# Patient Record
Sex: Female | Born: 1970 | Race: White | Hispanic: No | Marital: Married | Smoking: Former smoker
Health system: Southern US, Community
[De-identification: ages and names within clinical notes are randomized; demographics above are authoritative.]

## PROBLEM LIST (undated history)

## (undated) DIAGNOSIS — Z8619 Personal history of other infectious and parasitic diseases: Secondary | ICD-10-CM

## (undated) DIAGNOSIS — K219 Gastro-esophageal reflux disease without esophagitis: Secondary | ICD-10-CM

## (undated) DIAGNOSIS — I10 Essential (primary) hypertension: Secondary | ICD-10-CM

## (undated) DIAGNOSIS — T7840XA Allergy, unspecified, initial encounter: Secondary | ICD-10-CM

## (undated) HISTORY — DX: Personal history of other infectious and parasitic diseases: Z86.19

## (undated) HISTORY — DX: Essential (primary) hypertension: I10

## (undated) HISTORY — DX: Allergy, unspecified, initial encounter: T78.40XA

## (undated) HISTORY — DX: Gastro-esophageal reflux disease without esophagitis: K21.9

---

## 2000-03-07 ENCOUNTER — Other Ambulatory Visit: Admission: RE | Admit: 2000-03-07 | Discharge: 2000-03-07 | Payer: Self-pay | Admitting: *Deleted

## 2004-01-11 ENCOUNTER — Emergency Department (HOSPITAL_COMMUNITY): Admission: EM | Admit: 2004-01-11 | Discharge: 2004-01-11 | Payer: Self-pay | Admitting: Emergency Medicine

## 2004-03-15 ENCOUNTER — Emergency Department (HOSPITAL_COMMUNITY): Admission: EM | Admit: 2004-03-15 | Discharge: 2004-03-15 | Payer: Self-pay | Admitting: Emergency Medicine

## 2007-04-10 ENCOUNTER — Ambulatory Visit (HOSPITAL_COMMUNITY): Admission: RE | Admit: 2007-04-10 | Discharge: 2007-04-10 | Payer: Self-pay | Admitting: Obstetrics and Gynecology

## 2007-07-14 ENCOUNTER — Inpatient Hospital Stay (HOSPITAL_COMMUNITY): Admission: AD | Admit: 2007-07-14 | Discharge: 2007-07-23 | Payer: Self-pay | Admitting: Obstetrics & Gynecology

## 2010-11-22 NOTE — Op Note (Signed)
Brenda Lopez, Brenda Lopez                  ACCOUNT NO.:  [id number]   MEDICAL RECORD NO.:  [id number]          PATIENT TYPE:  INP   LOCATION:  9128                          FACILITY:  [facility]   PHYSICIAN:  Kendra H. Tenny Craw, MD     DATE OF BIRTH:  [birth year]   DATE OF PROCEDURE:  07/15/2007  DATE OF DISCHARGE:                               OPERATIVE REPORT   PREOPERATIVE DIAGNOSES:  1. 41-week intrauterine pregnancy.  2. Maternal exhaustion and severe neck pain.  3. Nonreassuring fetal well being.  4. Failure to progress.   POSTOPERATIVE DIAGNOSES:  1. 41-week intrauterine pregnancy.  2. Maternal exhaustion and severe neck pain.  3. Nonreassuring fetal well being.  4. Failure to progress.   PROCEDURE:  Primary low transverse cesarean section via Pfannenstiel's  skin incision.   SURGEON:  Freddrick March. Tenny Craw, MD   ASSISTANT:  None.   ANESTHESIA:  General.   SPECIMEN:  Placenta for disposal.   ESTIMATED BLOOD LOSS:  600 mL.   COMPLICATIONS:  None.   OPERATIVE FINDINGS:  Vigorous female infant in vertex presentation  weighing 7 pounds 6 ounces with Apgar scores of 9 and 9.  Cord pH of  7.3.  A tight nuchal cord x2 was noted.  Normal-appearing ovaries, tubes  and uterus.   PROCEDURE:  Brenda Lopez is a 40 year old G2 P0 who was admitted on  July 14, 2007 for a post dates induction of labor.  She underwent  misoprostol induction followed by Pitocin.  She received her epidural  the morning of July 15, 2007 at around 8 o'clock in the morning and  obtained excellent pain control from that.  At this point she was at 1-2  cm dilated.  As she progressed throughout the day she began to develop  severe neck pain and the epidural was discontinued.  The neck pain did  improve however then she developed significant discomfort from labor  pains.  Her epidural was then restarted with good improvement of her  contraction pain; however, she had recurrence of her severe neck pain  and again the epidural  was discontinued.  At 5:30 she was found to be 9  cm dilated and had made to good cervical change to this point.  Again  she developed significant discomfort from labor pain in the epidural was  again restarted and again after some time she developed significant neck  pain and it had to be discontinued.  Also during this time the fetus was  noted to be having some late variable appearing decelerations after  contractions.  She had excellent variability with 10-15 beat  accelerations in between contractions, excellent return to baseline and  excellent variability in between contractions but Pitocin was  discontinued due to this.  She was rechecked again at 7 o'clock, still  found to be 9 cm dilated, she continued to labor but again developed  significant the pain from labor and had her epidural redosed with a  stronger medication again with recurrence of neck pain.  She was then  reevaluated at 7:45.  At this time and there  was some concern for some  decreased variability with the fetal heart tracing with the baseline  rising to the 160s and the infant and the fetus was noted to be having  more severe appearing late variables with slow return to baseline.  At  this time it was discussed with the patient and additionally she was  still noted to be 9 cm dilated.  Good scalp stimulation could be  obtained when cervical exam was performed of this and the fetus was  palpated reassuring fetal well-being.  At this point it was discussed  with the patient options for waiting another 45 minutes until it had  been about 3 hours since she was 9 cm dilated and reevaluating to see if  she had become complete versus proceeding with a primary cesarean  section with the understanding that given the significant neck pain that  she was experiencing with her epidural she would likely need general  anesthesia.  After long discussion, the patient elected to proceed with  cesarean section.  The patient was  brought to the operating room, placed  in dorsal supine position in a leftward tilt, prepped and draped in the  normal sterile fashion.  At this time, general anesthesia was  administered and a scalpel was then used to make a Pfannenstiel's skin  incision which was carried down through the underlying layers of soft  tissue to the fascia.  The fascia was incised in the midline fascia and  the fascial incision was extended with blunt dissection.  The superior  aspect of the fascia was tented up and sharply dissected off of the  underlying rectus muscles with the Mayo scissors.  The rectus muscles  were separated in the midline.  The abdominal peritoneum was identified  and entered bluntly.  This incision was then extended with blunt  dissection.  Bladder blade was inserted.  The vesicouterine peritoneum  was identified.  A bladder flap was not created as this was done under  general anesthesia and in an emergent fashion.  A scalpel was then used  to make a low transverse incision on the uterus which was extended  laterally with blunt dissection.  The fetal vertex was identified,  brought up through the uterine incision.  The fetal vertex was  delivered.  A tight nuchal cord x2 was noted and reduced.  The infant  cried vigorously on the operative field.  The infant's body was then  delivered.  Cord was clamped and cut and the infant was passed to the  waiting isolette and pediatricians.  The cord pH was then collected.  The placenta was then spontaneously delivered.  Uterus was exteriorized,  cleared of all clot and debris.  Uterine incision was repaired with a #1  chromic in a running locked fashion.  A second imbricating layer was  then performed.  A figure-of-eight suture was used to control bleeding  on the right-hand aspect of the uterine incision.  The ovaries and tubes  were inspected and found to be normal.  The uterus was then returned to  the abdominal cavity, abdominal cavity was  cleared of all clot and  debris.  The uterine angles were then reinspected and found to be  hemostatic.  The abdominal peritoneum was then reapproximated with 2-0  Vicryl in a running fashion.  The rectus muscles were then  reapproximated with #1 chromic with two figure-of-eight sutures and the  fascia was closed with a looped PDS.  The skin was closed with staples.  All sponge, lap, needle counts were correct x2.  The patient was brought  to the recovery room in stable condition following the procedure.      Freddrick March. Tenny Craw, MD  Electronically Signed     [facility]/MEDQ  D:  07/15/2007  T:  07/16/2007  Job:  [id number]

## 2010-11-25 NOTE — Discharge Summary (Signed)
NAMETORRIA, FROMER                  ACCOUNT NO.:  [id number]   MEDICAL RECORD NO.:  [id number]          PATIENT TYPE:  INP   LOCATION:  9101                          FACILITY:  [facility]   PHYSICIAN:  Kendra H. Tenny Craw, MD     DATE OF BIRTH:  [birth year]   DATE OF ADMISSION:  07/14/2007  DATE OF DISCHARGE:  07/23/2007                               DISCHARGE SUMMARY   FINAL DIAGNOSES:  1. Intrauterine gestation at 41 weeks.  2. Advanced maternal age.  3. Post dates induction.  4. Questionable concern of intrauterine growth retardation.  5. Nonreassuring fetal well-being.  6. Failure to progress and maternal exhaustion with severe neck pain.  7. Postoperative preeclampsia.   PROCEDURE:  Primary low transverse cesarean section.   SURGEON:  Freddrick March. Tenny Craw, M.D.   COMPLICATIONS:  None.   ANESTHESIA:  General.    This 40 year old G2, P0 was admitted on January 4 for post dates  induction.  The patient underwent misoprostal induction followed by  Pitocin and had an epidural on the morning of January 5.  The patient's  antepartum course up to this point had been complicated by advanced  maternal age and questionable diagnosis of IUGR.  Her last ultrasound  did put the baby more in normal range, around the 18th percentile,  within normal fluid index.  The patient had a nonstress tests that were  reactive.  She is admitted this time for her induction.  The patient was  1-2 cm dilated at this morning of the fifth.  She progressed throughout  the day.  At this point the patient started developing severe neck pain.  An epidural was discontinued.  The neck pain did improve.  However, at  this point the patient started getting some significant pain from labor.  An epidural was restarted with good improvement of her pain.  The neck  pain began again and later that afternoon the patient was already 9 cm  dilated.  The fetus at this point started to show some late variable  decelerations with good  variability.  When the patient was checked a  couple hours later there was no change in her cervix. She was still 0  dilated.  She was still having this recurrence of neck pain and at this  point there is some decreased variability in the fetal heart tracing.  At this point a discussion was held with the patient and decision was  made to proceed with a cesarean section.   At this point a discussion was made about general anesthesia secondary  the patient's significant neck pain with her epidural.  The patient was  taken to the operating room under general anesthesia on July 14, 2007  by Dr. Waynard Reeds where a primary low transverse cesarean section was  performed with the delivery of a 7 pound 6 ounce female infant with Apgars  of 9 and 9.  There was a tight nuchal cord x2 noted and a cord pH of  7.3.  Delivery went without complications.  The patient's postoperative  course was complicated by postoperative preeclampsia.  She was  transferred to the NICU for magnesium sulfate prophylaxis and  preeclamptic labs.   By postoperative day #6, the patient's urine output and everything was  stabilizing.  She was transferred to the mother/baby unit.  Was taken  off magnesium sulfate and started on Procardia XL 30 mg daily.  The  patient was kept in-house until postoperative day #8 and the patient was  ready for discharge at this time.  Liver function tests were slightly  improving.  Blood pressures were stable and the patient was felt ready  for discharge.   She was sent home on a regular diet, told to decrease her activities,  to continue her Procardia 30 mg XL daily, was given Percocet one to two  every 4-6 hours as needed for the pain.  She is to follow up that  Friday, January 16, for a blood pressure check and reevaluation.  Instructions and precautions were reviewed with the patient and she was  to call if any significant changes.   DISCHARGE LABORATORY DATA:  The patient had a final  hemoglobin of 11.6,  white blood cell count of 9, platelets of 380,000 and a preeclamptic  panel which did show a slight improvement in her liver function tests  and no elevation of her LDH.      Leilani Able, P.A.-C.      Freddrick March. Tenny Craw, MD  Electronically Signed    MB/MEDQ  D:  08/06/2007  T:  08/06/2007  Job:  [id number]

## 2011-03-30 LAB — COMPREHENSIVE METABOLIC PANEL
ALT: 38 — ABNORMAL HIGH
ALT: 41 — ABNORMAL HIGH
ALT: 64 — ABNORMAL HIGH
AST: 46 — ABNORMAL HIGH
AST: 52 — ABNORMAL HIGH
AST: 54 — ABNORMAL HIGH
AST: 64 — ABNORMAL HIGH
Albumin: 2.6 — ABNORMAL LOW
Alkaline Phosphatase: 182 — ABNORMAL HIGH
Alkaline Phosphatase: 205 — ABNORMAL HIGH
CO2: 24
CO2: 27
Calcium: 7.6 — ABNORMAL LOW
Chloride: 104
Chloride: 105
Chloride: 106
Creatinine, Ser: 0.64
Creatinine, Ser: 0.68
GFR calc Af Amer: >60
GFR calc Af Amer: >60
GFR calc Af Amer: >60
GFR calc Af Amer: >60
GFR calc non Af Amer: >60
GFR calc non Af Amer: >60
Potassium: 3.8
Potassium: 4.1
Sodium: 137
Sodium: 138
Total Bilirubin: 0.5
Total Bilirubin: 0.6
Total Bilirubin: 0.7
Total Protein: 5.2 — ABNORMAL LOW
Total Protein: 6.6

## 2011-03-30 LAB — HEPATIC FUNCTION PANEL
Alkaline Phosphatase: 216 — ABNORMAL HIGH
Bilirubin, Direct: 0.1
Indirect Bilirubin: 0.3
Total Bilirubin: 0.4
Total Protein: 6.7

## 2011-03-30 LAB — CBC
HCT: 29.6 — ABNORMAL LOW
HCT: 33.2 — ABNORMAL LOW
HCT: 36.4
Hemoglobin: 11 — ABNORMAL LOW
MCHC: 34.4
MCHC: 35
MCV: 92
MCV: 93.6
Platelets: 186
Platelets: 239
Platelets: 380
RBC: 3.22 — ABNORMAL LOW
RBC: 3.41 — ABNORMAL LOW
RBC: 3.45 — ABNORMAL LOW
RDW: 12.9
RDW: 13.3
RDW: 13.5
WBC: 15.3 — ABNORMAL HIGH
WBC: 21.3 — ABNORMAL HIGH
WBC: 8.5
WBC: 9

## 2011-03-30 LAB — DIFFERENTIAL
Basophils Absolute: 0.1
Eosinophils Absolute: 0.1
Eosinophils Relative: 1
Lymphocytes Relative: 9 — ABNORMAL LOW

## 2011-03-30 LAB — LACTATE DEHYDROGENASE: LDH: 179

## 2011-03-30 LAB — RPR: RPR Ser Ql: NONREACTIVE

## 2011-04-20 LAB — CBC
HCT: 35.1 — ABNORMAL LOW
Hemoglobin: 12.3
MCV: 92.1
Platelets: 252
RDW: 12.5

## 2011-04-20 LAB — RH IMMUNE GLOBULIN WORKUP (NOT WOMEN'S HOSP)

## 2011-04-24 LAB — HM MAMMOGRAPHY: HM Mammogram: NORMAL

## 2011-09-22 ENCOUNTER — Other Ambulatory Visit (INDEPENDENT_AMBULATORY_CARE_PROVIDER_SITE_OTHER): Payer: PRIVATE HEALTH INSURANCE

## 2011-09-22 ENCOUNTER — Encounter: Payer: Self-pay | Admitting: Internal Medicine

## 2011-09-22 ENCOUNTER — Ambulatory Visit (INDEPENDENT_AMBULATORY_CARE_PROVIDER_SITE_OTHER): Payer: PRIVATE HEALTH INSURANCE | Admitting: Internal Medicine

## 2011-09-22 VITALS — BP 128/76 | HR 86 | Temp 98.2°F | Resp 16 | Wt 122.0 lb

## 2011-09-22 DIAGNOSIS — Z0000 Encounter for general adult medical examination without abnormal findings: Secondary | ICD-10-CM | POA: Insufficient documentation

## 2011-09-22 LAB — URINALYSIS, ROUTINE W REFLEX MICROSCOPIC
Ketones, ur: NEGATIVE
Total Protein, Urine: NEGATIVE
Urine Glucose: NEGATIVE
Urobilinogen, UA: 0.2 (ref 0.0–1.0)

## 2011-09-22 LAB — COMPREHENSIVE METABOLIC PANEL
ALT: 16 U/L (ref 0–35)
AST: 19 U/L (ref 0–37)
Albumin: 4.1 g/dL (ref 3.5–5.2)
Calcium: 9.1 mg/dL (ref 8.4–10.5)
Chloride: 105 mEq/L (ref 96–112)
Potassium: 3.7 mEq/L (ref 3.5–5.1)
Sodium: 138 mEq/L (ref 135–145)

## 2011-09-22 LAB — LIPID PANEL
Cholesterol: 185 mg/dL (ref 0–200)
HDL: 81.1 mg/dL (ref >39.00)
LDL Cholesterol: 87 mg/dL (ref 0–99)
Total CHOL/HDL Ratio: 2
Triglycerides: 86 mg/dL (ref 0.0–149.0)

## 2011-09-22 LAB — CBC WITH DIFFERENTIAL/PLATELET
Basophils Absolute: 0.1 10*3/uL (ref 0.0–0.1)
Basophils Relative: 0.7 % (ref 0.0–3.0)
Eosinophils Absolute: 0.3 10*3/uL (ref 0.0–0.7)
Lymphocytes Relative: 24.7 % (ref 12.0–46.0)
MCHC: 33.7 g/dL (ref 30.0–36.0)
Neutrophils Relative %: 59.9 % (ref 43.0–77.0)
Platelets: 254 10*3/uL (ref 150.0–400.0)
RBC: 4.51 Mil/uL (ref 3.87–5.11)
RDW: 12.5 % (ref 11.5–14.6)

## 2011-09-22 NOTE — Patient Instructions (Signed)
Preventive Care for Adults, Female A healthy lifestyle and preventive care can promote health and wellness. Preventive health guidelines for women include the following key practices.  A routine yearly physical is a good way to check with your caregiver about your health and preventive screening. It is a chance to share any concerns and updates on your health, and to receive a thorough exam.   Visit your dentist for a routine exam and preventive care every 6 months. Brush your teeth twice a day and floss once a day. Good oral hygiene prevents tooth decay and gum disease.   The frequency of eye exams is based on your age, health, family medical history, use of contact lenses, and other factors. Follow your caregiver's recommendations for frequency of eye exams.   Eat a healthy diet. Foods like vegetables, fruits, whole grains, low-fat dairy products, and lean protein foods contain the nutrients you need without too many calories. Decrease your intake of foods high in solid fats, added sugars, and salt. Eat the right amount of calories for you.Get information about a proper diet from your caregiver, if necessary.   Regular physical exercise is one of the most important things you can do for your health. Most adults should get at least 150 minutes of moderate-intensity exercise (any activity that increases your heart rate and causes you to sweat) each week. In addition, most adults need muscle-strengthening exercises on 2 or more days a week.   Maintain a healthy weight. The body mass index (BMI) is a screening tool to identify possible weight problems. It provides an estimate of body fat based on height and weight. Your caregiver can help determine your BMI, and can help you achieve or maintain a healthy weight.For adults 20 years and older:   A BMI below 18.5 is considered underweight.   A BMI of 18.5 to 24.9 is normal.   A BMI of 25 to 29.9 is considered overweight.   A BMI of 30 and above is  considered obese.   Maintain normal blood lipids and cholesterol levels by exercising and minimizing your intake of saturated fat. Eat a balanced diet with plenty of fruit and vegetables. Blood tests for lipids and cholesterol should begin at age 20 and be repeated every 5 years. If your lipid or cholesterol levels are high, you are over 50, or you are at high risk for heart disease, you may need your cholesterol levels checked more frequently.Ongoing high lipid and cholesterol levels should be treated with medicines if diet and exercise are not effective.   If you smoke, find out from your caregiver how to quit. If you do not use tobacco, do not start.   If you are pregnant, do not drink alcohol. If you are breastfeeding, be very cautious about drinking alcohol. If you are not pregnant and choose to drink alcohol, do not exceed 1 drink per day. One drink is considered to be 12 ounces (355 mL) of beer, 5 ounces (148 mL) of wine, or 1.5 ounces (44 mL) of liquor.   Avoid use of street drugs. Do not share needles with anyone. Ask for help if you need support or instructions about stopping the use of drugs.   High blood pressure causes heart disease and increases the risk of stroke. Your blood pressure should be checked at least every 1 to 2 years. Ongoing high blood pressure should be treated with medicines if weight loss and exercise are not effective.   If you are 55 to 41   years old, ask your caregiver if you should take aspirin to prevent strokes.   Diabetes screening involves taking a blood sample to check your fasting blood sugar level. This should be done once every 3 years, after age 45, if you are within normal weight and without risk factors for diabetes. Testing should be considered at a younger age or be carried out more frequently if you are overweight and have at least 1 risk factor for diabetes.   Breast cancer screening is essential preventive care for women. You should practice "breast  self-awareness." This means understanding the normal appearance and feel of your breasts and may include breast self-examination. Any changes detected, no matter how small, should be reported to a caregiver. Women in their 20s and 30s should have a clinical breast exam (CBE) by a caregiver as part of a regular health exam every 1 to 3 years. After age 40, women should have a CBE every year. Starting at age 40, women should consider having a mammography (breast X-ray test) every year. Women who have a family history of breast cancer should talk to their caregiver about genetic screening. Women at a high risk of breast cancer should talk to their caregivers about having magnetic resonance imaging (MRI) and a mammography every year.   The Pap test is a screening test for cervical cancer. A Pap test can show cell changes on the cervix that might become cervical cancer if left untreated. A Pap test is a procedure in which cells are obtained and examined from the lower end of the uterus (cervix).   Women should have a Pap test starting at age 21.   Between ages 21 and 29, Pap tests should be repeated every 2 years.   Beginning at age 30, you should have a Pap test every 3 years as long as the past 3 Pap tests have been normal.   Some women have medical problems that increase the chance of getting cervical cancer. Talk to your caregiver about these problems. It is especially important to talk to your caregiver if a new problem develops soon after your last Pap test. In these cases, your caregiver may recommend more frequent screening and Pap tests.   The above recommendations are the same for women who have or have not gotten the vaccine for human papillomavirus (HPV).   If you had a hysterectomy for a problem that was not cancer or a condition that could lead to cancer, then you no longer need Pap tests. Even if you no longer need a Pap test, a regular exam is a good idea to make sure no other problems are  starting.   If you are between ages 65 and 70, and you have had normal Pap tests going back 10 years, you no longer need Pap tests. Even if you no longer need a Pap test, a regular exam is a good idea to make sure no other problems are starting.   If you have had past treatment for cervical cancer or a condition that could lead to cancer, you need Pap tests and screening for cancer for at least 20 years after your treatment.   If Pap tests have been discontinued, risk factors (such as a new sexual partner) need to be reassessed to determine if screening should be resumed.   The HPV test is an additional test that may be used for cervical cancer screening. The HPV test looks for the virus that can cause the cell changes on the cervix.   The cells collected during the Pap test can be tested for HPV. The HPV test could be used to screen women aged 30 years and older, and should be used in women of any age who have unclear Pap test results. After the age of 30, women should have HPV testing at the same frequency as a Pap test.   Colorectal cancer can be detected and often prevented. Most routine colorectal cancer screening begins at the age of 50 and continues through age 75. However, your caregiver may recommend screening at an earlier age if you have risk factors for colon cancer. On a yearly basis, your caregiver may provide home test kits to check for hidden blood in the stool. Use of a small camera at the end of a tube, to directly examine the colon (sigmoidoscopy or colonoscopy), can detect the earliest forms of colorectal cancer. Talk to your caregiver about this at age 50, when routine screening begins. Direct examination of the colon should be repeated every 5 to 10 years through age 75, unless early forms of pre-cancerous polyps or small growths are found.   Hepatitis C blood testing is recommended for all people born from 1945 through 1965 and any individual with known risks for hepatitis C.    Practice safe sex. Use condoms and avoid high-risk sexual practices to reduce the spread of sexually transmitted infections (STIs). STIs include gonorrhea, chlamydia, syphilis, trichomonas, herpes, HPV, and human immunodeficiency virus (HIV). Herpes, HIV, and HPV are viral illnesses that have no cure. They can result in disability, cancer, and death. Sexually active women aged 25 and younger should be checked for chlamydia. Older women with new or multiple partners should also be tested for chlamydia. Testing for other STIs is recommended if you are sexually active and at increased risk.   Osteoporosis is a disease in which the bones lose minerals and strength with aging. This can result in serious bone fractures. The risk of osteoporosis can be identified using a bone density scan. Women ages 65 and over and women at risk for fractures or osteoporosis should discuss screening with their caregivers. Ask your caregiver whether you should take a calcium supplement or vitamin D to reduce the rate of osteoporosis.   Menopause can be associated with physical symptoms and risks. Hormone replacement therapy is available to decrease symptoms and risks. You should talk to your caregiver about whether hormone replacement therapy is right for you.   Use sunscreen with sun protection factor (SPF) of 30 or more. Apply sunscreen liberally and repeatedly throughout the day. You should seek shade when your shadow is shorter than you. Protect yourself by wearing long sleeves, pants, a wide-brimmed hat, and sunglasses year round, whenever you are outdoors.   Once a month, do a whole body skin exam, using a mirror to look at the skin on your back. Notify your caregiver of new moles, moles that have irregular borders, moles that are larger than a pencil eraser, or moles that have changed in shape or color.   Stay current with required immunizations.   Influenza. You need a dose every fall (or winter). The composition of  the flu vaccine changes each year, so being vaccinated once is not enough.   Pneumococcal polysaccharide. You need 1 to 2 doses if you smoke cigarettes or if you have certain chronic medical conditions. You need 1 dose at age 65 (or older) if you have never been vaccinated.   Tetanus, diphtheria, pertussis (Tdap, Td). Get 1 dose of   Tdap vaccine if you are younger than age 65, are over 65 and have contact with an infant, are a healthcare worker, are pregnant, or simply want to be protected from whooping cough. After that, you need a Td booster dose every 10 years. Consult your caregiver if you have not had at least 3 tetanus and diphtheria-containing shots sometime in your life or have a deep or dirty wound.   HPV. You need this vaccine if you are a woman age 26 or younger. The vaccine is given in 3 doses over 6 months.   Measles, mumps, rubella (MMR). You need at least 1 dose of MMR if you were born in 1957 or later. You may also need a second dose.   Meningococcal. If you are age 19 to 21 and a first-year college student living in a residence hall, or have one of several medical conditions, you need to get vaccinated against meningococcal disease. You may also need additional booster doses.   Zoster (shingles). If you are age 60 or older, you should get this vaccine.   Varicella (chickenpox). If you have never had chickenpox or you were vaccinated but received only 1 dose, talk to your caregiver to find out if you need this vaccine.   Hepatitis A. You need this vaccine if you have a specific risk factor for hepatitis A virus infection or you simply wish to be protected from this disease. The vaccine is usually given as 2 doses, 6 to 18 months apart.   Hepatitis B. You need this vaccine if you have a specific risk factor for hepatitis B virus infection or you simply wish to be protected from this disease. The vaccine is given in 3 doses, usually over 6 months.  Preventive Services /  Frequency Ages 19 to 39  Blood pressure check.** / Every 1 to 2 years.   Lipid and cholesterol check.** / Every 5 years beginning at age 20.   Clinical breast exam.** / Every 3 years for women in their 20s and 30s.   Pap test.** / Every 2 years from ages 21 through 29. Every 3 years starting at age 30 through age 65 or 70 with a history of 3 consecutive normal Pap tests.   HPV screening.** / Every 3 years from ages 30 through ages 65 to 70 with a history of 3 consecutive normal Pap tests.   Hepatitis C blood test.** / For any individual with known risks for hepatitis C.   Skin self-exam. / Monthly.   Influenza immunization.** / Every year.   Pneumococcal polysaccharide immunization.** / 1 to 2 doses if you smoke cigarettes or if you have certain chronic medical conditions.   Tetanus, diphtheria, pertussis (Tdap, Td) immunization. / A one-time dose of Tdap vaccine. After that, you need a Td booster dose every 10 years.   HPV immunization. / 3 doses over 6 months, if you are 26 and younger.   Measles, mumps, rubella (MMR) immunization. / You need at least 1 dose of MMR if you were born in 1957 or later. You may also need a second dose.   Meningococcal immunization. / 1 dose if you are age 19 to 21 and a first-year college student living in a residence hall, or have one of several medical conditions, you need to get vaccinated against meningococcal disease. You may also need additional booster doses.   Varicella immunization.** / Consult your caregiver.   Hepatitis A immunization.** / Consult your caregiver. 2 doses, 6 to 18 months   apart.   Hepatitis B immunization.** / Consult your caregiver. 3 doses usually over 6 months.  Ages 40 to 64  Blood pressure check.** / Every 1 to 2 years.   Lipid and cholesterol check.** / Every 5 years beginning at age 20.   Clinical breast exam.** / Every year after age 40.   Mammogram.** / Every year beginning at age 40 and continuing for as  long as you are in good health. Consult with your caregiver.   Pap test.** / Every 3 years starting at age 30 through age 65 or 70 with a history of 3 consecutive normal Pap tests.   HPV screening.** / Every 3 years from ages 30 through ages 65 to 70 with a history of 3 consecutive normal Pap tests.   Fecal occult blood test (FOBT) of stool. / Every year beginning at age 50 and continuing until age 75. You may not need to do this test if you get a colonoscopy every 10 years.   Flexible sigmoidoscopy or colonoscopy.** / Every 5 years for a flexible sigmoidoscopy or every 10 years for a colonoscopy beginning at age 50 and continuing until age 75.   Hepatitis C blood test.** / For all people born from 1945 through 1965 and any individual with known risks for hepatitis C.   Skin self-exam. / Monthly.   Influenza immunization.** / Every year.   Pneumococcal polysaccharide immunization.** / 1 to 2 doses if you smoke cigarettes or if you have certain chronic medical conditions.   Tetanus, diphtheria, pertussis (Tdap, Td) immunization.** / A one-time dose of Tdap vaccine. After that, you need a Td booster dose every 10 years.   Measles, mumps, rubella (MMR) immunization. / You need at least 1 dose of MMR if you were born in 1957 or later. You may also need a second dose.   Varicella immunization.** / Consult your caregiver.   Meningococcal immunization.** / Consult your caregiver.   Hepatitis A immunization.** / Consult your caregiver. 2 doses, 6 to 18 months apart.   Hepatitis B immunization.** / Consult your caregiver. 3 doses, usually over 6 months.  Ages 65 and over  Blood pressure check.** / Every 1 to 2 years.   Lipid and cholesterol check.** / Every 5 years beginning at age 20.   Clinical breast exam.** / Every year after age 40.   Mammogram.** / Every year beginning at age 40 and continuing for as long as you are in good health. Consult with your caregiver.   Pap test.** /  Every 3 years starting at age 30 through age 65 or 70 with a 3 consecutive normal Pap tests. Testing can be stopped between 65 and 70 with 3 consecutive normal Pap tests and no abnormal Pap or HPV tests in the past 10 years.   HPV screening.** / Every 3 years from ages 30 through ages 65 or 70 with a history of 3 consecutive normal Pap tests. Testing can be stopped between 65 and 70 with 3 consecutive normal Pap tests and no abnormal Pap or HPV tests in the past 10 years.   Fecal occult blood test (FOBT) of stool. / Every year beginning at age 50 and continuing until age 75. You may not need to do this test if you get a colonoscopy every 10 years.   Flexible sigmoidoscopy or colonoscopy.** / Every 5 years for a flexible sigmoidoscopy or every 10 years for a colonoscopy beginning at age 50 and continuing until age 75.   Hepatitis   C blood test.** / For all people born from [birth year] through 1965 and any individual with known risks for hepatitis C.   Osteoporosis screening.** / A one-time screening for women ages 23 and over and women at risk for fractures or osteoporosis.   Skin self-exam. / Monthly.   Influenza immunization.** / Every year.   Pneumococcal polysaccharide immunization.** / 1 dose at age 63 (or older) if you have never been vaccinated.   Tetanus, diphtheria, pertussis (Tdap, Td) immunization. / A one-time dose of Tdap vaccine if you are over 65 and have contact with an infant, are a Research scientist (physical sciences), or simply want to be protected from whooping cough. After that, you need a Td booster dose every 10 years.   Varicella immunization.** / Consult your caregiver.   Meningococcal immunization.** / Consult your caregiver.   Hepatitis A immunization.** / Consult your caregiver. 2 doses, 6 to 18 months apart.   Hepatitis B immunization.** / Check with your caregiver. 3 doses, usually over 6 months.  ** Family history and personal history of risk and conditions may change your caregiver's  recommendations. Document Released: 08/22/2001 Document Revised: 06/15/2011 Document Reviewed: 11/21/2010 [facility] Patient Information 2012 [place], [place].Alcohol Problems Most adults who drink alcohol drink in moderation (not a lot) are at low risk for developing problems related to their drinking. However, all drinkers, including low-risk drinkers, should know about the health risks connected with drinking alcohol. RECOMMENDATIONS FOR LOW-RISK DRINKING  Drink in moderation. Moderate drinking is defined as follows:   Men - no more than 2 drinks per day.   Nonpregnant women - no more than 1 drink per day.   Over age 42 - no more than 1 drink per day.  A standard drink is 12 grams of pure alcohol, which is equal to a 12 ounce bottle of beer or wine cooler, a 5 ounce glass of wine, or 1.5 ounces of distilled spirits (such as whiskey, brandy, vodka, or rum).  ABSTAIN FROM (DO NOT DRINK) ALCOHOL:  When pregnant or considering pregnancy.   When taking a medication that interacts with alcohol.   If you are alcohol dependent.   A medical condition that prohibits drinking alcohol (such as ulcer, liver disease, or heart disease).  DISCUSS WITH YOUR CAREGIVER:  If you are at risk for coronary heart disease, discuss the potential benefits and risks of alcohol use: Light to moderate drinking is associated with lower rates of coronary heart disease in certain populations (for example, men over age 17 and postmenopausal women). Infrequent or nondrinkers are advised not to begin light to moderate drinking to reduce the risk of coronary heart disease so as to avoid creating an alcohol-related problem. Similar protective effects can likely be gained through proper diet and exercise.   Women and the elderly have smaller amounts of body water than men. As a result women and the elderly achieve a higher blood alcohol concentration after drinking the same amount of alcohol.   Exposing a fetus to alcohol can  cause a broad range of birth defects referred to as Fetal Alcohol Syndrome (FAS) or Alcohol-Related Birth Defects (ARBD). Although FAS/ARBD is connected with excessive alcohol consumption during pregnancy, studies also have reported neurobehavioral problems in infants born to mothers reporting drinking an average of 1 drink per day during pregnancy.   Heavier drinking (the consumption of more than 4 drinks per occasion by men and more than 3 drinks per occasion by women) impairs learning (cognitive) and psychomotor functions and increases the risk  of alcohol-related problems, including accidents and injuries.  CAGE QUESTIONS:   Have you ever felt that you should Cut down on your drinking?   Have people Annoyed you by criticizing your drinking?   Have you ever felt bad or Guilty about your drinking?   Have you ever had a drink first thing in the morning to steady your nerves or get rid of a hangover (Eye opener)?  If you answered positively to any of these questions: You may be at risk for alcohol-related problems if alcohol consumption is:   Men: Greater than 14 drinks per week or more than 4 drinks per occasion.   Women: Greater than 7 drinks per week or more than 3 drinks per occasion.  Do you or your family have a medical history of alcohol-related problems, such as:  Blackouts.   Sexual dysfunction.   Depression.   Trauma.   Liver dysfunction.   Sleep disorders.   Hypertension.   Chronic abdominal pain.   Has your drinking ever caused you problems, such as problems with your family, problems with your work (or school) performance, or accidents/injuries?   Do you have a compulsion to drink or a preoccupation with drinking?   Do you have poor control or are you unable to stop drinking once you have started?   Do you have to drink to avoid withdrawal symptoms?   Do you have problems with withdrawal such as tremors, nausea, sweats, or mood disturbances?   Does it take  more alcohol than in the past to get you high?   Do you feel a strong urge to drink?   Do you change your plans so that you can have a drink?   Do you ever drink in the morning to relieve the shakes or a hangover?  If you have answered a number of the previous questions positively, it may be time for you to talk to your caregivers, family, and friends and see if they think you have a problem. Alcoholism is a chemical dependency that keeps getting worse and will eventually destroy your health and relationships. Many alcoholics end up dead, impoverished, or in prison. This is often the end result of all chemical dependency.  Do not be discouraged if you are not ready to take action immediately.   Decisions to change behavior often involve up and down desires to change and feeling like you cannot decide.   Try to think more seriously about your drinking behavior.   Think of the reasons to quit.  WHERE TO GO FOR ADDITIONAL INFORMATION   The National Institute on Alcohol Abuse and Alcoholism (NIAAA)www.niaaa.nih.gov   [employer] on Alcoholism and Drug Dependence (NCADD)www.ncadd.org   American Society of Addiction Medicine (ASAM)www.https://anderson-johnson.com/  Document Released: 06/26/2005 Document Revised: 06/15/2011 Document Reviewed: 02/12/2008 [facility] Patient Information 2012 [place], [place].

## 2011-09-22 NOTE — Progress Notes (Signed)
  Subjective:    Patient ID: Brenda Lopez, female    DOB: 1970-07-16, 41 y.o.   MRN: [id number]  HPI New to me for a physical she saw a GYN last October, she feels well today and she offers no complaints.   Review of Systems  Constitutional: Negative.   HENT: Negative.   Eyes: Negative.   Respiratory: Negative.   Cardiovascular: Negative.   Gastrointestinal: Negative.   Genitourinary: Negative.   Musculoskeletal: Negative.   Skin: Negative.   Neurological: Negative.   Hematological: Negative.   Psychiatric/Behavioral: Negative.        Objective:   Physical Exam  Vitals reviewed. Constitutional: She is oriented to person, place, and time. She appears well-developed and well-nourished. No distress.  HENT:  Head: Normocephalic and atraumatic.  Mouth/Throat: Oropharynx is clear and moist. No oropharyngeal exudate.  Eyes: Conjunctivae are normal. Right eye exhibits no discharge. Left eye exhibits no discharge. No scleral icterus.  Neck: Normal range of motion. Neck supple. No JVD present. No tracheal deviation present. No thyromegaly present.  Cardiovascular: Normal rate, regular rhythm, normal heart sounds and intact distal pulses.  Exam reveals no gallop and no friction rub.   No murmur heard. Pulmonary/Chest: Effort normal and breath sounds normal. No stridor. No respiratory distress. She has no wheezes. She has no rales. She exhibits no tenderness.  Abdominal: Soft. Bowel sounds are normal. She exhibits no distension and no mass. There is no tenderness. There is no rebound and no guarding.  Musculoskeletal: Normal range of motion. She exhibits no edema and no tenderness.  Lymphadenopathy:    She has no cervical adenopathy.  Neurological: She is oriented to person, place, and time.  Skin: Skin is warm and dry. No rash noted. She is not diaphoretic. No erythema. No pallor.  Psychiatric: She has a normal mood and affect. Her behavior is normal. Judgment and thought content normal.        Lab Results  Component Value Date   WBC 9.0 07/23/2007   HGB 11.6* 07/23/2007   HCT 33.2* 07/23/2007   PLT 380 DELTA CHECK NOTED 07/23/2007   GLUCOSE 91 07/23/2007   ALT 64* 07/23/2007   AST 54* 07/23/2007   NA 138 07/23/2007   K 3.8 07/23/2007   CL 106 07/23/2007   CREATININE 0.61 07/23/2007   BUN 7 07/23/2007   CO2 23 07/23/2007      Assessment & Plan:

## 2011-09-22 NOTE — Assessment & Plan Note (Signed)
Her alcohol intake is alarming, I asked her to see  Melvyn Novas or  Corinne Ports for therapy and to start attending AA meetnigs, also gave her pt ed material today about this issues, exam done, labs ordered, vaccines were updated, pt ed material was given

## 2015-02-03 ENCOUNTER — Telehealth: Payer: Self-pay | Admitting: Internal Medicine

## 2015-02-03 NOTE — Telephone Encounter (Signed)
ok 

## 2015-02-03 NOTE — Telephone Encounter (Signed)
Patient is requesting to reestablish with [facility].  Please advise.

## 2015-02-03 NOTE — Telephone Encounter (Signed)
Got scheduled  °

## 2015-02-09 ENCOUNTER — Ambulatory Visit (INDEPENDENT_AMBULATORY_CARE_PROVIDER_SITE_OTHER): Payer: Federal, State, Local not specified - PPO | Admitting: Internal Medicine

## 2015-02-09 ENCOUNTER — Ambulatory Visit (INDEPENDENT_AMBULATORY_CARE_PROVIDER_SITE_OTHER)
Admission: RE | Admit: 2015-02-09 | Discharge: 2015-02-09 | Disposition: A | Discharge status: Home / Self Care | Payer: Federal, State, Local not specified - PPO | Source: Ambulatory Visit | Attending: Internal Medicine | Admitting: Internal Medicine

## 2015-02-09 ENCOUNTER — Encounter: Payer: Self-pay | Admitting: Internal Medicine

## 2015-02-09 ENCOUNTER — Other Ambulatory Visit (INDEPENDENT_AMBULATORY_CARE_PROVIDER_SITE_OTHER): Payer: Federal, State, Local not specified - PPO

## 2015-02-09 VITALS — BP 128/82 | HR 82 | Temp 98.3°F | Resp 16 | Ht 64.25 in | Wt 121.0 lb

## 2015-02-09 DIAGNOSIS — R002 Palpitations: Secondary | ICD-10-CM

## 2015-02-09 DIAGNOSIS — R059 Cough, unspecified: Secondary | ICD-10-CM

## 2015-02-09 DIAGNOSIS — J45901 Unspecified asthma with (acute) exacerbation: Secondary | ICD-10-CM | POA: Insufficient documentation

## 2015-02-09 DIAGNOSIS — R05 Cough: Secondary | ICD-10-CM

## 2015-02-09 DIAGNOSIS — Z0000 Encounter for general adult medical examination without abnormal findings: Secondary | ICD-10-CM

## 2015-02-09 DIAGNOSIS — J4531 Mild persistent asthma with (acute) exacerbation: Secondary | ICD-10-CM

## 2015-02-09 LAB — T4: T4 TOTAL: 7.1 ug/dL (ref 4.5–12.0)

## 2015-02-09 LAB — COMPREHENSIVE METABOLIC PANEL
ALBUMIN: 4.5 g/dL (ref 3.5–5.2)
ALK PHOS: 57 U/L (ref 39–117)
ALT: 16 U/L (ref 0–35)
AST: 21 U/L (ref 0–37)
BILIRUBIN TOTAL: 0.4 mg/dL (ref 0.2–1.2)
BUN: 10 mg/dL (ref 6–23)
CALCIUM: 9.7 mg/dL (ref 8.4–10.5)
CO2: 29 mEq/L (ref 19–32)
CREATININE: 0.81 mg/dL (ref 0.40–1.20)
Chloride: 102 mEq/L (ref 96–112)
GFR: 81.63 mL/min (ref >60.00)
GLUCOSE: 100 mg/dL — AB (ref 70–99)
Potassium: 4.1 mEq/L (ref 3.5–5.1)
Sodium: 138 mEq/L (ref 135–145)
TOTAL PROTEIN: 7.7 g/dL (ref 6.0–8.3)

## 2015-02-09 LAB — CBC WITH DIFFERENTIAL/PLATELET
BASOS PCT: 0.6 % (ref 0.0–3.0)
Basophils Absolute: 0 10*3/uL (ref 0.0–0.1)
EOS ABS: 0.1 10*3/uL (ref 0.0–0.7)
EOS PCT: 2 % (ref 0.0–5.0)
HEMATOCRIT: 42.9 % (ref 36.0–46.0)
Hemoglobin: 14.7 g/dL (ref 12.0–15.0)
LYMPHS ABS: 1.5 10*3/uL (ref 0.7–4.0)
LYMPHS PCT: 24 % (ref 12.0–46.0)
MCHC: 34.3 g/dL (ref 30.0–36.0)
MCV: 91.8 fl (ref 78.0–100.0)
MONOS PCT: 10.9 % (ref 3.0–12.0)
Monocytes Absolute: 0.7 10*3/uL (ref 0.1–1.0)
NEUTROS PCT: 62.5 % (ref 43.0–77.0)
Neutro Abs: 3.9 10*3/uL (ref 1.4–7.7)
PLATELETS: 288 10*3/uL (ref 150.0–400.0)
RBC: 4.68 Mil/uL (ref 3.87–5.11)
RDW: 13.3 % (ref 11.5–15.5)
WBC: 6.3 10*3/uL (ref 4.0–10.5)

## 2015-02-09 LAB — T3, FREE: T3 FREE: 3.6 pg/mL (ref 2.3–4.2)

## 2015-02-09 LAB — TSH: TSH: 1.89 u[IU]/mL (ref 0.35–4.50)

## 2015-02-09 MED ORDER — BUDESONIDE-FORMOTEROL FUMARATE 80-4.5 MCG/ACT IN AERO: 2.0000 | INHALATION_SPRAY | Freq: Two times a day (BID) | RESPIRATORY_TRACT | Status: AC

## 2015-02-09 NOTE — Progress Notes (Signed)
Subjective:  Patient ID: Brenda Lopez, female    DOB: 1971/06/23  Age: 44 y.o. MRN: [id number]  CC: Palpitations and Cough   HPI Brenda Lopez presents for palpitations that have been occurring for 1-2 years as well as a one-month history of cough. She states for the last 1-2 years at night when she is in the bed she feels a pounding sensation in her heart. When this happens she feels anxious and concerned. She has a history of preeclampsia. She has several emotional stressors. She does not report any palpitations during the day or with exertion. She says the pounding sensation in her heart is not associated with any irregular heartbeats, slow heart rate or elevated heart rate. She also complains of cough with wheezing and shortness of breath for the last month. She said initially the cough was productive of phlegm but more recently it has been nonproductive.  No outpatient prescriptions prior to visit.   No facility-administered medications prior to visit.    ROS Review of Systems  Constitutional: Negative.  Negative for fever, chills, diaphoresis, activity change, appetite change, fatigue and unexpected weight change.  HENT: Negative.  Negative for congestion, sinus pressure, sore throat and trouble swallowing.   Eyes: Negative.   Respiratory: Positive for cough, shortness of breath and wheezing. Negative for apnea, choking, chest tightness and stridor.   Cardiovascular: Positive for palpitations. Negative for chest pain and leg swelling.  Gastrointestinal: Negative.  Negative for nausea, vomiting, abdominal pain, diarrhea, constipation and blood in stool.  Endocrine: Negative.   Genitourinary: Negative.   Musculoskeletal: Negative.   Skin: Negative.  Negative for rash.  Allergic/Immunologic: Negative.   Neurological: Negative.  Negative for dizziness, syncope, speech difficulty, weakness, light-headedness, numbness and headaches.  Hematological: Negative.   Psychiatric/Behavioral:  Negative for suicidal ideas, hallucinations, behavioral problems, confusion, sleep disturbance, dysphoric mood and decreased concentration. The patient is nervous/anxious. The patient is not hyperactive.     Objective:  BP 128/82 mmHg  Pulse 82  Temp(Src) 98.3 F (36.8 C) (Oral)  Resp 16  Ht 5' 4.25" (1.632 m)  Wt 121 lb (54.885 kg)  BMI 20.61 kg/m2  SpO2 98%  LMP 02/07/2015 (Exact Date)  BP Readings from Last 3 Encounters:  02/09/15 128/82  09/22/11 128/76    Wt Readings from Last 3 Encounters:  02/09/15 121 lb (54.885 kg)  09/22/11 122 lb (55.339 kg)    Physical Exam  Constitutional: She is oriented to person, place, and time. She appears well-developed and well-nourished.  Non-toxic appearance. She does not have a sickly appearance. She does not appear ill. No distress.  HENT:  Mouth/Throat: Oropharynx is clear and moist. No oropharyngeal exudate.  Eyes: Conjunctivae are normal. Right eye exhibits no discharge. Left eye exhibits no discharge. No scleral icterus.  Neck: Normal range of motion. Neck supple. No JVD present. No tracheal deviation present. No thyromegaly present.  Cardiovascular: Normal rate, regular rhythm, normal heart sounds and intact distal pulses.  Exam reveals no gallop and no friction rub.   No murmur heard. EKG- Normal sinus rhythm with no dysrhythmia, benign-appearing RSR'. Otherwise normal  Pulmonary/Chest: Effort normal and breath sounds normal. No accessory muscle usage or stridor. No tachypnea. No respiratory distress. She has no decreased breath sounds. She has no wheezes. She has no rhonchi. She has no rales. She exhibits no tenderness.  Abdominal: Soft. Bowel sounds are normal. She exhibits no distension and no mass. There is no tenderness. There is no rebound and no guarding.  Musculoskeletal:  Normal range of motion. She exhibits no edema or tenderness.  Lymphadenopathy:    She has no cervical adenopathy.  Neurological: She is oriented to  person, place, and time.  Skin: Skin is warm and dry. No rash noted. She is not diaphoretic. No erythema. No pallor.  Psychiatric: Her behavior is normal. Judgment and thought content normal. Her mood appears anxious. Her affect is not angry, not blunt, not labile and not inappropriate. Her speech is not rapid and/or pressured. Cognition and memory are normal. She does not exhibit a depressed mood.  Vitals reviewed.   Lab Results  Component Value Date   WBC 6.3 02/09/2015   HGB 14.7 02/09/2015   HCT 42.9 02/09/2015   PLT 288.0 02/09/2015   GLUCOSE 100* 02/09/2015   CHOL 185 09/22/2011   TRIG 86.0 09/22/2011   HDL 81.10 09/22/2011   LDLCALC 87 09/22/2011   ALT 16 02/09/2015   AST 21 02/09/2015   NA 138 02/09/2015   K 4.1 02/09/2015   CL 102 02/09/2015   CREATININE 0.81 02/09/2015   BUN 10 02/09/2015   CO2 29 02/09/2015   TSH 1.89 02/09/2015    No results found.  Assessment & Plan:   Zara was seen today for palpitations and cough.  Diagnoses and all orders for this visit:  Routine general medical examination at a health care facility  Cough- her exam and chest x-ray are normal. She appears to have a post viral upper respiratory infection cough. No antibiotics are needed. I will start treating the asthma. Orders: -     DG Chest 2 View; Future  Palpitations- her EKG is normal today. This appears to be a benign sensation associated with emotional stressors. Will get a 72 hour Holter monitor done to see what her heart rate is during the night when she feels the pounding in her chest. I will check her labs to look for secondary causes of palpitations. Orders: -     TSH; Future -     T4; Future -     T3, free; Future -     Comprehensive metabolic panel; Future -     CBC with Differential/Platelet; Future -     EKG 12-Lead -     Holter monitor - 72 hour; Future  Asthma with acute exacerbation, mild persistent Orders: -     budesonide-formoterol (SYMBICORT) 80-4.5 MCG/ACT  inhaler; Inhale 2 puffs into the lungs 2 (two) times daily.   I am having Ms. Luper start on budesonide-formoterol.  Meds ordered this encounter  Medications  . budesonide-formoterol (SYMBICORT) 80-4.5 MCG/ACT inhaler    Sig: Inhale 2 puffs into the lungs 2 (two) times daily.    Dispense:  1 Inhaler    Refill:  11     Follow-up: Return in about 3 weeks (around 03/02/2015).  Sanda Linger, MD

## 2015-02-09 NOTE — Patient Instructions (Signed)
Cough, Adult  A cough is a reflex that helps clear your throat and airways. It can help heal the body or may be a reaction to an irritated airway. A cough may only last 2 or 3 weeks (acute) or may last more than 8 weeks (chronic).  CAUSES Acute cough:  Viral or bacterial infections. Chronic cough:  Infections.  Allergies.  Asthma.  Post-nasal drip.  Smoking.  Heartburn or acid reflux.  Some medicines.  Chronic lung problems (COPD).  Cancer. SYMPTOMS   Cough.  Fever.  Chest pain.  Increased breathing rate.  High-pitched whistling sound when breathing (wheezing).  Colored mucus that you cough up (sputum). TREATMENT   A bacterial cough may be treated with antibiotic medicine.  A viral cough must run its course and will not respond to antibiotics.  Your caregiver may recommend other treatments if you have a chronic cough. HOME CARE INSTRUCTIONS   Only take over-the-counter or prescription medicines for pain, discomfort, or fever as directed by your caregiver. Use cough suppressants only as directed by your caregiver.  Use a cold steam vaporizer or humidifier in your bedroom or home to help loosen secretions.  Sleep in a semi-upright position if your cough is worse at night.  Rest as needed.  Stop smoking if you smoke. SEEK IMMEDIATE MEDICAL CARE IF:   You have pus in your sputum.  Your cough starts to worsen.  You cannot control your cough with suppressants and are losing sleep.  You begin coughing up blood.  You have difficulty breathing.  You develop pain which is getting worse or is uncontrolled with medicine.  You have a fever. MAKE SURE YOU:   Understand these instructions.  Will watch your condition.  Will get help right away if you are not doing well or get worse. Document Released: 12/23/2010 Document Revised: 09/18/2011 Document Reviewed: 12/23/2010 [employer] Patient Information 2015 [employer]. This information is not intended  to replace advice given to you by your health care provider. Make sure you discuss any questions you have with your health care provider.  

## 2015-02-10 ENCOUNTER — Encounter: Payer: Self-pay | Admitting: Internal Medicine

## 2015-02-27 ENCOUNTER — Other Ambulatory Visit: Payer: Self-pay | Admitting: Internal Medicine

## 2015-02-27 DIAGNOSIS — R002 Palpitations: Secondary | ICD-10-CM

## 2017-01-01 IMAGING — CR DG CHEST 2V
2 series · 2 of 2 positions shown · non-contrast
Comparison: None in PACs

CLINICAL DATA: One month history of shortness of breath, cough,
wheezing, and chest congestion, history of asthma, remote history of
tobacco use

EXAM:
CHEST  2 VIEW

[view not recorded (1 of 2)]
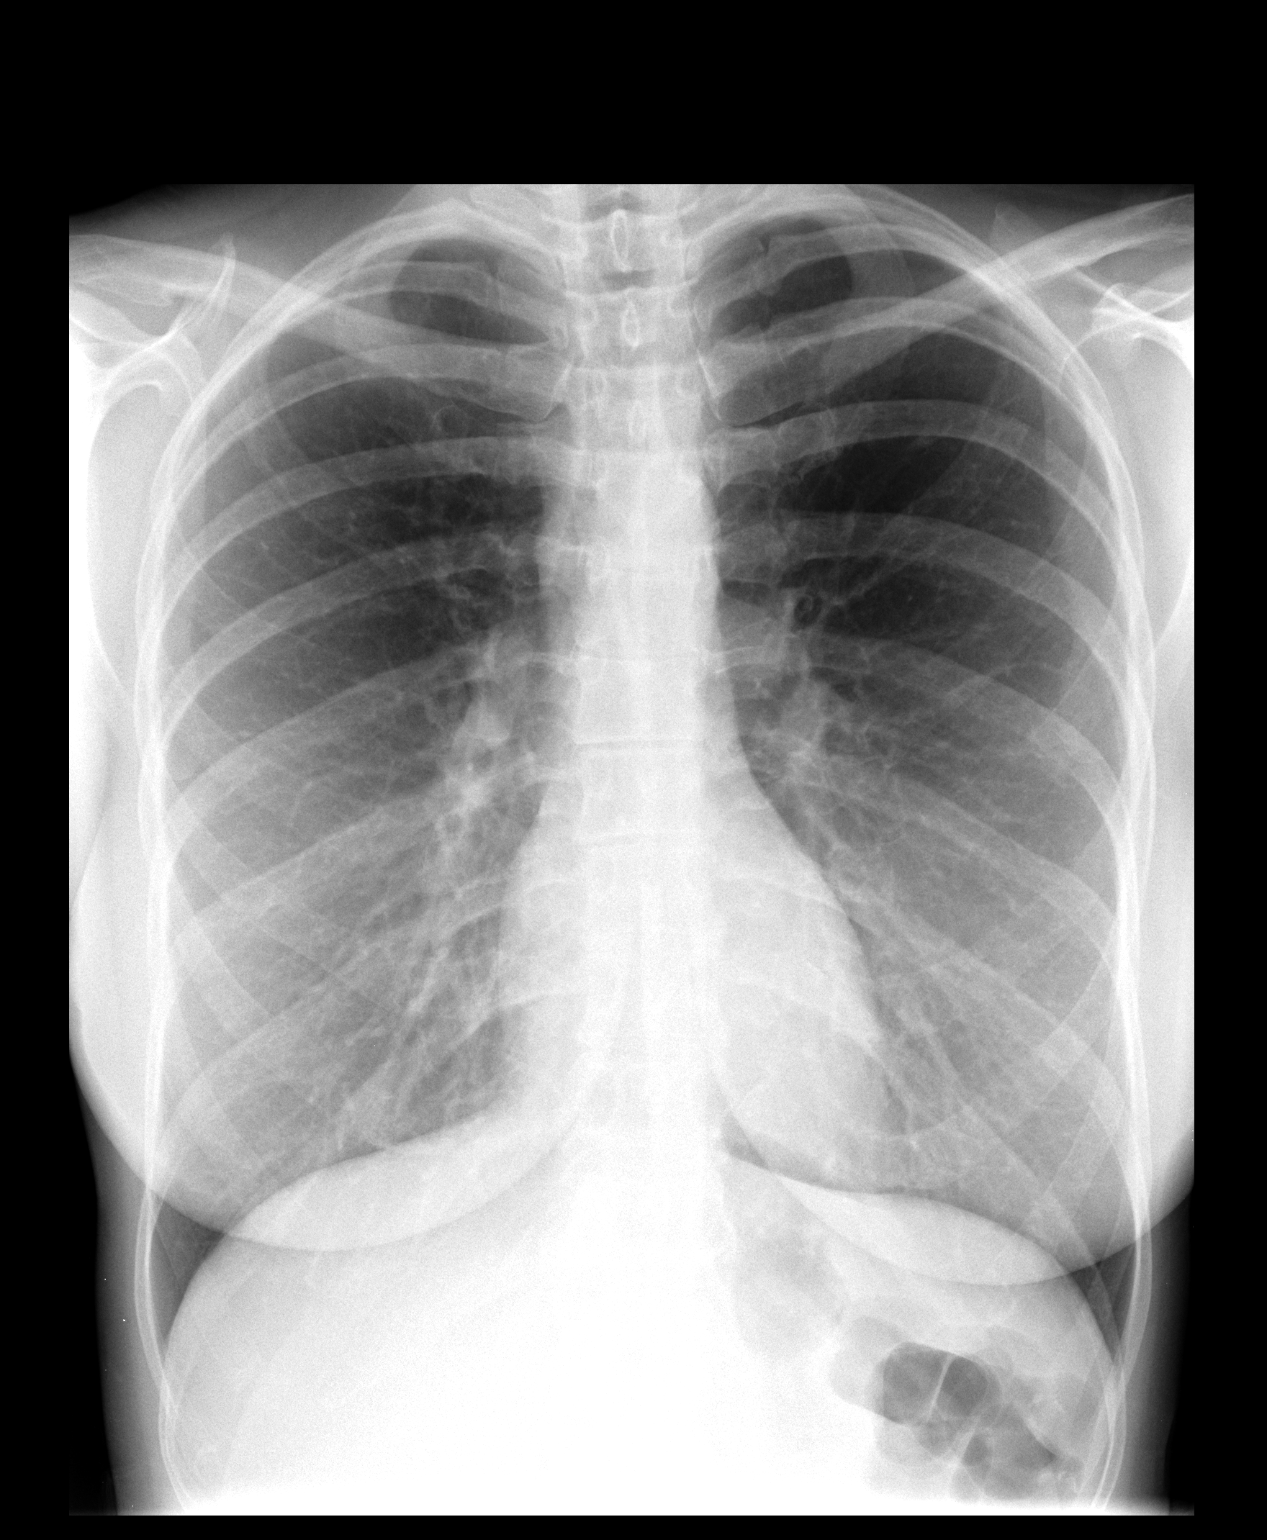

[view not recorded (2 of 2)]
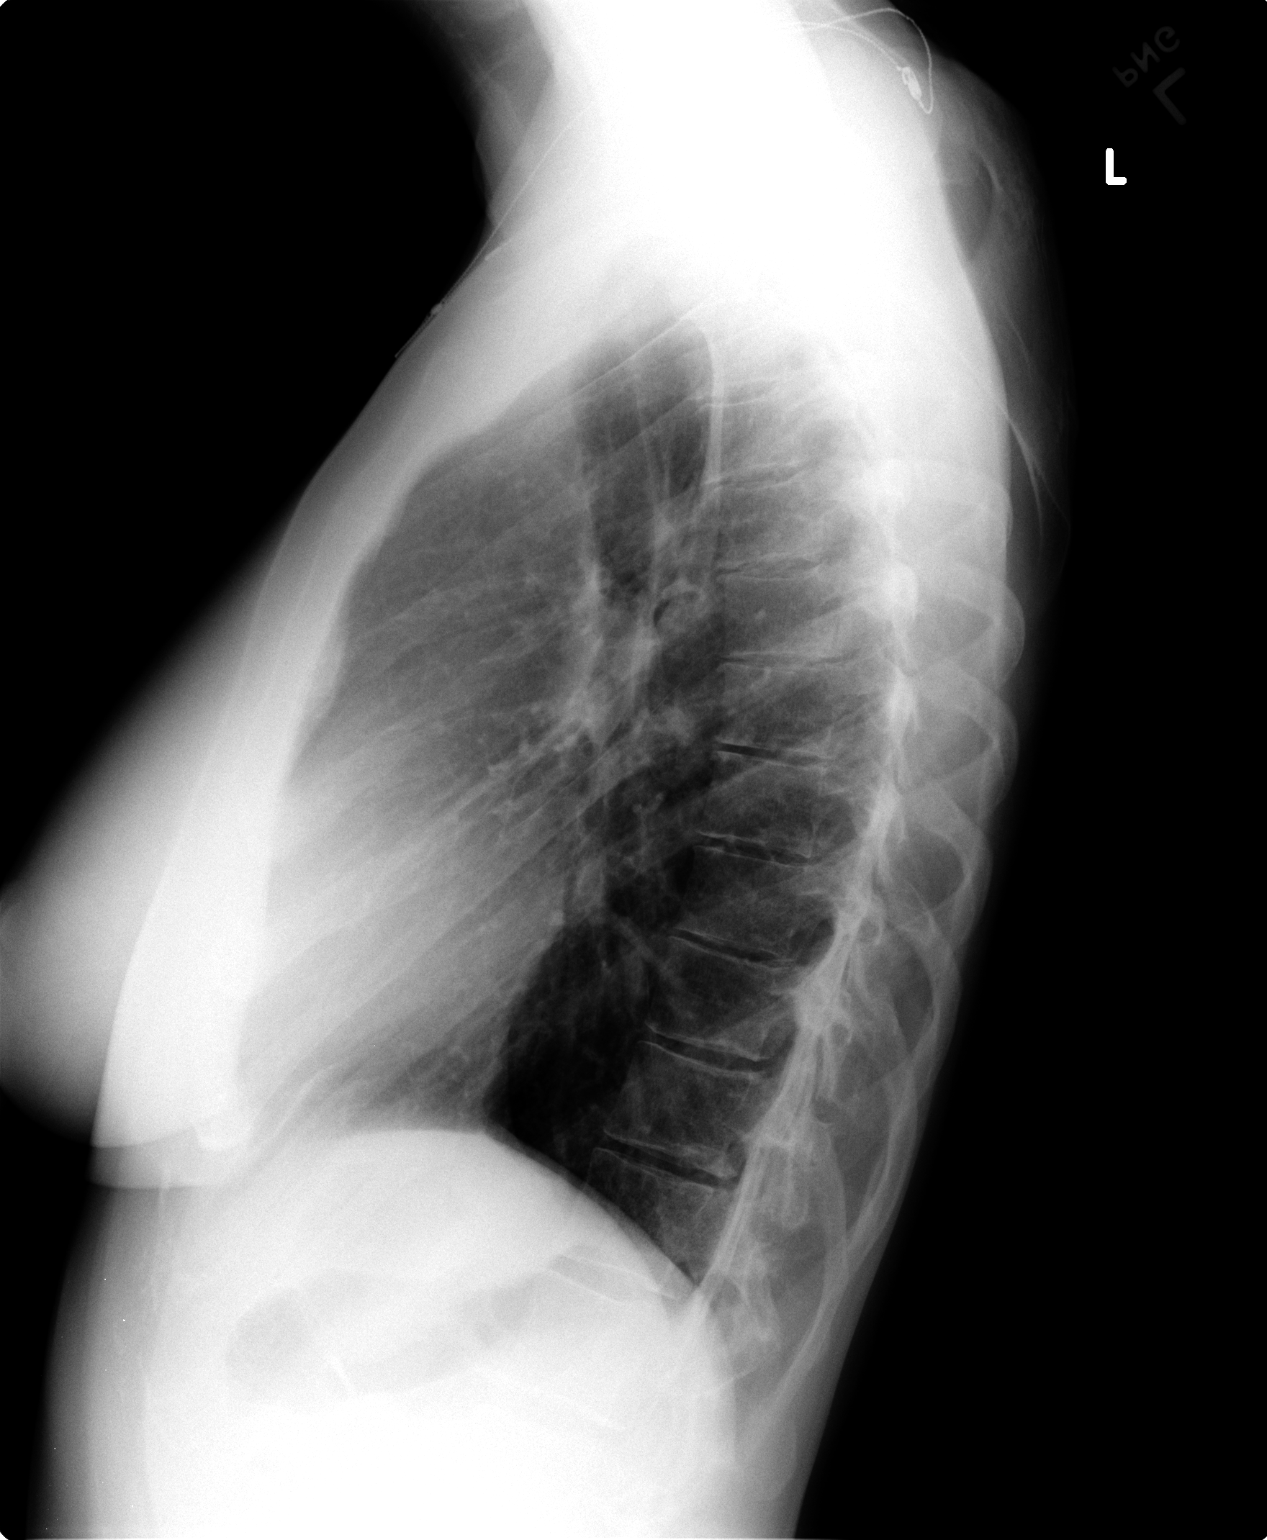

[2 of 2 positions shown; findings below may reference images not displayed]

FINDINGS: The lungs are adequately inflated. There is no focal infiltrate.
There is no pleural effusion. The heart and pulmonary vascularity
are normal. The mediastinum is normal in width. The bony thorax is
unremarkable.
IMPRESSION: There is no active cardiopulmonary disease.

## 2017-11-26 LAB — HM PAP SMEAR

## 2017-11-27 ENCOUNTER — Encounter: Payer: Self-pay | Admitting: Internal Medicine

## 2022-05-04 DIAGNOSIS — Z124 Encounter for screening for malignant neoplasm of cervix: Secondary | ICD-10-CM | POA: Diagnosis not present

## 2022-05-04 DIAGNOSIS — Z13220 Encounter for screening for lipoid disorders: Secondary | ICD-10-CM | POA: Diagnosis not present

## 2022-05-04 DIAGNOSIS — Z1231 Encounter for screening mammogram for malignant neoplasm of breast: Secondary | ICD-10-CM | POA: Diagnosis not present

## 2022-05-04 DIAGNOSIS — Z01419 Encounter for gynecological examination (general) (routine) without abnormal findings: Secondary | ICD-10-CM | POA: Diagnosis not present

## 2022-05-04 DIAGNOSIS — Z1329 Encounter for screening for other suspected endocrine disorder: Secondary | ICD-10-CM | POA: Diagnosis not present

## 2022-05-04 DIAGNOSIS — Z130 Encounter for screening for diseases of the blood and blood-forming organs and certain disorders involving the immune mechanism: Secondary | ICD-10-CM | POA: Diagnosis not present

## 2022-06-04 LAB — COLOGUARD: COLOGUARD: NEGATIVE

## 2023-10-01 DIAGNOSIS — Z1231 Encounter for screening mammogram for malignant neoplasm of breast: Secondary | ICD-10-CM | POA: Diagnosis not present

## 2023-10-01 DIAGNOSIS — Z01419 Encounter for gynecological examination (general) (routine) without abnormal findings: Secondary | ICD-10-CM | POA: Diagnosis not present

## 2023-11-21 DIAGNOSIS — H8190 Unspecified disorder of vestibular function, unspecified ear: Secondary | ICD-10-CM | POA: Diagnosis not present

## 2023-11-21 DIAGNOSIS — H8309 Labyrinthitis, unspecified ear: Secondary | ICD-10-CM | POA: Diagnosis not present

## 2023-11-29 ENCOUNTER — Emergency Department (HOSPITAL_COMMUNITY): Admission: EM | Admit: 2023-11-29 | Discharge: 2023-11-29 | Disposition: A | Discharge status: Home / Self Care

## 2023-11-29 ENCOUNTER — Emergency Department (HOSPITAL_COMMUNITY)

## 2023-11-29 ENCOUNTER — Other Ambulatory Visit: Payer: Self-pay

## 2023-11-29 ENCOUNTER — Encounter (HOSPITAL_COMMUNITY): Payer: Self-pay

## 2023-11-29 DIAGNOSIS — R0600 Dyspnea, unspecified: Secondary | ICD-10-CM | POA: Diagnosis not present

## 2023-11-29 DIAGNOSIS — E860 Dehydration: Secondary | ICD-10-CM | POA: Diagnosis not present

## 2023-11-29 DIAGNOSIS — R42 Dizziness and giddiness: Secondary | ICD-10-CM | POA: Diagnosis not present

## 2023-11-29 LAB — CBC WITH DIFFERENTIAL/PLATELET
Abs Immature Granulocytes: 0.03 10*3/uL (ref 0.00–0.07)
Basophils Absolute: 0.1 10*3/uL (ref 0.0–0.1)
Basophils Relative: 1 %
Eosinophils Absolute: 0.4 10*3/uL (ref 0.0–0.5)
Eosinophils Relative: 6 %
HCT: 44.5 % (ref 36.0–46.0)
Hemoglobin: 14.7 g/dL (ref 12.0–15.0)
Immature Granulocytes: 0 %
Lymphocytes Relative: 27 %
Lymphs Abs: 1.8 10*3/uL (ref 0.7–4.0)
MCH: 30.7 pg (ref 26.0–34.0)
MCHC: 33 g/dL (ref 30.0–36.0)
MCV: 92.9 fL (ref 80.0–100.0)
Monocytes Absolute: 1.1 10*3/uL — ABNORMAL HIGH (ref 0.1–1.0)
Monocytes Relative: 17 %
Neutro Abs: 3.3 10*3/uL (ref 1.7–7.7)
Neutrophils Relative %: 49 %
Platelets: 257 10*3/uL (ref 150–400)
RBC: 4.79 MIL/uL (ref 3.87–5.11)
RDW: 12.5 % (ref 11.5–15.5)
WBC: 6.8 10*3/uL (ref 4.0–10.5)
nRBC: 0 % (ref 0.0–0.2)

## 2023-11-29 LAB — COMPREHENSIVE METABOLIC PANEL WITH GFR
ALT: 17 U/L (ref 0–44)
AST: 22 U/L (ref 15–41)
Albumin: 4 g/dL (ref 3.5–5.0)
Alkaline Phosphatase: 72 U/L (ref 38–126)
Anion gap: 10 (ref 5–15)
BUN: 13 mg/dL (ref 6–20)
CO2: 25 mmol/L (ref 22–32)
Calcium: 9 mg/dL (ref 8.9–10.3)
Chloride: 99 mmol/L (ref 98–111)
Creatinine, Ser: 0.73 mg/dL (ref 0.44–1.00)
GFR, Estimated: >60 mL/min (ref >60)
Glucose, Bld: 86 mg/dL (ref 70–99)
Potassium: 3.6 mmol/L (ref 3.5–5.1)
Sodium: 134 mmol/L — ABNORMAL LOW (ref 135–145)
Total Bilirubin: 0.4 mg/dL (ref 0.0–1.2)
Total Protein: 7.7 g/dL (ref 6.5–8.1)

## 2023-11-29 LAB — CBG MONITORING, ED: Glucose-Capillary: 84 mg/dL (ref 70–99)

## 2023-11-29 LAB — D-DIMER, QUANTITATIVE: D-Dimer, Quant: <0.27 ug{FEU}/mL (ref 0.00–0.50)

## 2023-11-29 LAB — TROPONIN I (HIGH SENSITIVITY): Troponin I (High Sensitivity): 2 ng/L (ref <18)

## 2023-11-29 LAB — TSH: TSH: 0.857 u[IU]/mL (ref 0.350–4.500)

## 2023-11-29 MED ORDER — LACTATED RINGERS IV BOLUS
1000.0000 mL | Freq: Once | INTRAVENOUS | Status: AC
  Administered 2023-11-29: 1000 mL via INTRAVENOUS

## 2023-11-29 MED ORDER — PROCHLORPERAZINE EDISYLATE 10 MG/2ML IJ SOLN
5.0000 mg | Freq: Once | INTRAMUSCULAR | Status: AC
  Administered 2023-11-29: 5 mg via INTRAVENOUS
  Filled 2023-11-29: qty 2

## 2023-11-29 MED ORDER — DIPHENHYDRAMINE HCL 50 MG/ML IJ SOLN
12.5000 mg | Freq: Once | INTRAMUSCULAR | Status: AC
  Administered 2023-11-29: 12.5 mg via INTRAVENOUS
  Filled 2023-11-29: qty 1

## 2023-11-29 MED ORDER — DIAZEPAM 5 MG/ML IJ SOLN
2.5000 mg | Freq: Once | INTRAMUSCULAR | Status: AC
  Administered 2023-11-29: 2.5 mg via INTRAVENOUS
  Filled 2023-11-29: qty 2

## 2023-11-29 MED ORDER — MECLIZINE HCL 25 MG PO TABS: 25.0000 mg | ORAL_TABLET | Freq: Three times a day (TID) | ORAL | 0 refills | Status: AC | PRN

## 2023-11-29 NOTE — ED Notes (Signed)
 Pt refused MRI- declined any other medication to help her relax

## 2023-11-29 NOTE — Discharge Instructions (Addendum)
 Your labs today were reassuring.  Please follow-up with the ENT doctor at the number provided.  If you have worsening symptoms please return to the emergency department for reevaluation.  Take the meclizine as prescribed.

## 2023-11-29 NOTE — ED Notes (Signed)
Pt given specimen cup for urine sample

## 2023-11-29 NOTE — ED Notes (Signed)
 Patient transported to MRI

## 2023-11-29 NOTE — ED Provider Notes (Signed)
 [facility] EMERGENCY DEPARTMENT AT [facility] Provider Note   CSN: [id number] Arrival date & time: 11/29/23  1107     History  Chief Complaint  Patient presents with   Dizziness    Brenda Lopez is a 53 y.o. female.  53 year old female with no reported past medical history presenting to the emergency department today with concern for ongoing dizziness.  The patient was seen by her primary care provider as well as by urgent care.  She was diagnosed with possible inner ear infection.  She has completed antibiotics and steroids and has been on meclizine with no improvement.  The patient states she is have been having persistent room spinning dizziness but also lightheadedness that has been going now for the past few weeks.  She denies any chest pain.  Does report some mild dyspnea occasionally.  She denies any leg pain or swelling.  Denies a history of DVT or pulmonary embolism, recent surgeries, recent travel.  She came to the ER today for further evaluation due to ongoing symptoms.  She denies any blood in her stool or dark stools.   Dizziness      Home Medications Prior to Admission medications   Medication Sig Start Date End Date Taking? Authorizing Provider  meclizine (ANTIVERT) 25 MG tablet Take 1 tablet (25 mg total) by mouth 3 (three) times daily as needed for dizziness. 11/29/23  Yes Carin Charleston, MD  budesonide -formoterol  (SYMBICORT ) 80-4.5 MCG/ACT inhaler Inhale 2 puffs into the lungs 2 (two) times daily. 02/09/15   Arcadio Knuckles, MD      Allergies    Patient has no known allergies.    Review of Systems   Review of Systems  Neurological:  Positive for dizziness and light-headedness.  All other systems reviewed and are negative.   Physical Exam Updated Vital Signs BP 133/67   Pulse 100   Temp 98.7 F (37.1 C) (Oral)   Resp 18   SpO2 100%  Physical Exam Vitals and nursing note reviewed.   Gen: NAD Eyes: PERRL, EOMI, L sided nystagmus HEENT: no  oropharyngeal swelling Neck: trachea midline Resp: clear to auscultation bilaterally Card: RRR, no murmurs, rubs, or gallops Abd: nontender, nondistended Extremities: no calf tenderness, no edema Vascular: 2+ radial pulses bilaterally, 2+ DP pulses bilaterally Neuro: No significant nystagmus noted, cranial nerves intact, equal strength and sensation throughout bilateral upper and lower extremities, the patient does have some inconsitent past-pointing on finger-to-nose with the right upper extremity Skin: no rashes Psyc: acting appropriately   ED Results / Procedures / Treatments   Labs (all labs ordered are listed, but only abnormal results are displayed) Labs Reviewed  CBC WITH DIFFERENTIAL/PLATELET - Abnormal; Notable for the following components:      Result Value   Monocytes Absolute 1.1 (*)    All other components within normal limits  COMPREHENSIVE METABOLIC PANEL WITH GFR - Abnormal; Notable for the following components:   Sodium 134 (*)    All other components within normal limits  D-DIMER, QUANTITATIVE  TSH  CBG MONITORING, ED  TROPONIN I (HIGH SENSITIVITY)  TROPONIN I (HIGH SENSITIVITY)    EKG EKG Interpretation Date/Time:  Thursday Nov 29 2023 11:13:02 EDT Ventricular Rate:  104 PR Interval:  163 QRS Duration:  98 QT Interval:  353 QTC Calculation: 465 R Axis:   65  Text Interpretation: Sinus tachycardia Biatrial enlargement RSR' in V1 or V2, right VCD or RVH Baseline wander in lead(s) I III aVR aVL Confirmed by Charlee Conine,  Debria Fang [phone]) on 11/29/2023 12:22:25 PM  Radiology No results found.  Procedures Procedures    Medications Ordered in ED Medications  lactated ringers bolus 1,000 mL (0 mLs Intravenous Stopped 11/29/23 1339)  diazepam (VALIUM) injection 2.5 mg (2.5 mg Intravenous Given 11/29/23 1229)  prochlorperazine (COMPAZINE) injection 5 mg (5 mg Intravenous Given 11/29/23 1419)  diphenhydrAMINE (BENADRYL) injection 12.5 mg (12.5 mg Intravenous Given  11/29/23 1419)    ED Course/ Medical Decision Making/ A&P                                 Medical Decision Making 53 year old female with no reported past medical history presenting to the emergency department today with persistent dizziness.  This been despite being treated for possible ear infection and meclizine.  I will further evaluate the patient here with basic labs to evaluate for anemia or electrolyte abnormalities.  Will obtain an EKG here as well and keep her on the cardiac monitor to evaluate for arrhythmias.  Will also obtain a TSH.  I will obtain a D-dimer as the patient is tachycardic here on arrival with the shortness of breath and tachycardia.  Will give her IV fluids in the event that she is dehydrated.  I will give her Valium here for the dizziness since she has not really improved much with the meclizine.  Will reevaluate for ultimate disposition.  The patient's symptoms resolved here after the medications.  She did not tolerate the MRI.  I discussed with the patient that her options would be to obtain a CT scan here but even if this is negative this would not rule out CVA.  With her symptoms going on now for the last month and a half I did discuss with the patient that with her symptoms going on this long that she could also follow-up with ENT or her primary care provider and they could potentially schedule more imaging and an open MRI as an outpatient.  The patient did not want to stay for CT scan as her symptoms have improved.  I will give her ENT follow-up with return precautions.  Amount and/or Complexity of Data Reviewed Labs: ordered. Radiology: ordered.  Risk Prescription drug management.           Final Clinical Impression(s) / ED Diagnoses Final diagnoses:  Dizziness    Rx / DC Orders ED Discharge Orders          Ordered    meclizine (ANTIVERT) 25 MG tablet  3 times daily PRN        11/29/23 1617              Carin Charleston, MD 11/29/23  [phone]

## 2023-11-29 NOTE — ED Triage Notes (Signed)
 Pt states she has been having dizziness for the past month and has been getting worse, has been taking meclizine for it w/o relief. Pt states she feels like she is going to pass out sometime with it. Denies any n/v. Pt does have sob but no chest pain with it.

## 2023-12-04 DIAGNOSIS — R42 Dizziness and giddiness: Secondary | ICD-10-CM | POA: Diagnosis not present

## 2023-12-04 DIAGNOSIS — H9203 Otalgia, bilateral: Secondary | ICD-10-CM | POA: Diagnosis not present

## 2023-12-11 DIAGNOSIS — L0100 Impetigo, unspecified: Secondary | ICD-10-CM | POA: Diagnosis not present

## 2023-12-11 DIAGNOSIS — L710 Perioral dermatitis: Secondary | ICD-10-CM | POA: Diagnosis not present

## 2023-12-25 ENCOUNTER — Other Ambulatory Visit: Payer: Self-pay

## 2023-12-25 ENCOUNTER — Emergency Department (HOSPITAL_COMMUNITY)

## 2023-12-25 ENCOUNTER — Emergency Department (HOSPITAL_COMMUNITY)
Admission: EM | Admit: 2023-12-25 | Discharge: 2023-12-25 | Disposition: A | Discharge status: Home / Self Care | Attending: Emergency Medicine | Admitting: Emergency Medicine

## 2023-12-25 DIAGNOSIS — E876 Hypokalemia: Secondary | ICD-10-CM | POA: Diagnosis not present

## 2023-12-25 DIAGNOSIS — R42 Dizziness and giddiness: Secondary | ICD-10-CM | POA: Insufficient documentation

## 2023-12-25 DIAGNOSIS — R000 Tachycardia, unspecified: Secondary | ICD-10-CM | POA: Insufficient documentation

## 2023-12-25 LAB — BASIC METABOLIC PANEL WITH GFR
Anion gap: 13 (ref 5–15)
BUN: 12 mg/dL (ref 6–20)
CO2: 21 mmol/L — ABNORMAL LOW (ref 22–32)
Calcium: 9.5 mg/dL (ref 8.9–10.3)
Chloride: 103 mmol/L (ref 98–111)
Creatinine, Ser: 0.7 mg/dL (ref 0.44–1.00)
GFR, Estimated: >60 mL/min (ref >60)
Glucose, Bld: 105 mg/dL — ABNORMAL HIGH (ref 70–99)
Potassium: 3.3 mmol/L — ABNORMAL LOW (ref 3.5–5.1)
Sodium: 137 mmol/L (ref 135–145)

## 2023-12-25 LAB — CBC WITH DIFFERENTIAL/PLATELET
Abs Immature Granulocytes: 0.09 10*3/uL — ABNORMAL HIGH (ref 0.00–0.07)
Basophils Absolute: 0.1 10*3/uL (ref 0.0–0.1)
Basophils Relative: 1 %
Eosinophils Absolute: 0.2 10*3/uL (ref 0.0–0.5)
Eosinophils Relative: 2 %
HCT: 41.6 % (ref 36.0–46.0)
Hemoglobin: 14.1 g/dL (ref 12.0–15.0)
Immature Granulocytes: 1 %
Lymphocytes Relative: 17 %
Lymphs Abs: 1.4 10*3/uL (ref 0.7–4.0)
MCH: 31.2 pg (ref 26.0–34.0)
MCHC: 33.9 g/dL (ref 30.0–36.0)
MCV: 92 fL (ref 80.0–100.0)
Monocytes Absolute: 0.9 10*3/uL (ref 0.1–1.0)
Monocytes Relative: 10 %
Neutro Abs: 5.7 10*3/uL (ref 1.7–7.7)
Neutrophils Relative %: 69 %
Platelets: 218 10*3/uL (ref 150–400)
RBC: 4.52 MIL/uL (ref 3.87–5.11)
RDW: 12.6 % (ref 11.5–15.5)
WBC: 8.3 10*3/uL (ref 4.0–10.5)
nRBC: 0 % (ref 0.0–0.2)

## 2023-12-25 LAB — CBG MONITORING, ED: Glucose-Capillary: 152 mg/dL — ABNORMAL HIGH (ref 70–99)

## 2023-12-25 MED ORDER — POTASSIUM CHLORIDE CRYS ER 20 MEQ PO TBCR
40.0000 meq | EXTENDED_RELEASE_TABLET | ORAL | Status: AC
  Administered 2023-12-25: 40 meq via ORAL
  Filled 2023-12-25: qty 2

## 2023-12-25 MED ORDER — SCOPOLAMINE 1 MG/3DAYS TD PT72
1.0000 | MEDICATED_PATCH | TRANSDERMAL | Status: DC
  Administered 2023-12-25: 1.5 mg via TRANSDERMAL
  Filled 2023-12-25: qty 1

## 2023-12-25 MED ORDER — SCOPOLAMINE 1 MG/3DAYS TD PT72: 1.0000 | MEDICATED_PATCH | TRANSDERMAL | 12 refills | Status: AC

## 2023-12-25 MED ORDER — DIAZEPAM 5 MG/ML IJ SOLN
5.0000 mg | Freq: Once | INTRAMUSCULAR | Status: AC
  Administered 2023-12-25: 5 mg via INTRAVENOUS
  Filled 2023-12-25: qty 2

## 2023-12-25 MED ORDER — LACTATED RINGERS IV BOLUS
1000.0000 mL | Freq: Once | INTRAVENOUS | Status: AC
  Administered 2023-12-25: 1000 mL via INTRAVENOUS

## 2023-12-25 NOTE — Discharge Instructions (Signed)
 You were seen for your dizziness in the emergency department.   At home, please take meclizine  or use the scopalamine patches we prescribed you.    Check your MyChart online for the results of any tests that had not resulted by the time you left the emergency department.   Follow-up with your primary doctor in 2-3 days regarding your visit.    Return immediately to the emergency department if you experience any of the following: worsening symptoms, or any other concerning symptoms.    Thank you for visiting our Emergency Department. It was a pleasure taking care of you today.

## 2023-12-25 NOTE — ED Provider Notes (Signed)
 [place] EMERGENCY DEPARTMENT AT [facility] Provider Note   CSN: [id number] Arrival date & time: 12/25/23  1002     Patient presents with: Dizziness (Pt here two weeks ago for the same, was offered MRI, but did not complete, feels she is ready for it now. Also endorsing pressure in head and difficulty focusing. )   Brenda Lopez is a 53 y.o. female.   53 year old female with history of vertigo presents emergency department dizziness.  Reports that over the past month she has been having intermittent spells of dizziness.  Typically worse in the morning when she wakes up.  Has difficulty characterizing them as room spinning or lightheaded sensation.  Does not appear to be particularly triggered by head turning or standing. Occational R ear tinnitus.  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  Stopped taking her meclizine  because she thinks is making worse.  Has a mild frontal headache.  Says that she feels very anxious at this point in time.  Was previously offered an MRI of the brain but could not tolerate it due to her anxiety but says that she would like for it to be done today.       Prior to Admission medications   Medication Sig Start Date End Date Taking? Authorizing Provider  scopolamine (TRANSDERM-SCOP) 1 MG/3DAYS Place 1 patch (1.5 mg total) onto the skin every 3 (three) days. 12/25/23  Yes Ninetta Basket, MD  budesonide -formoterol  (SYMBICORT ) 80-4.5 MCG/ACT inhaler Inhale 2 puffs into the lungs 2 (two) times daily. 02/09/15   Arcadio Knuckles, MD  meclizine  (ANTIVERT ) 25 MG tablet Take 1 tablet (25 mg total) by mouth 3 (three) times daily as needed for dizziness. 11/29/23   Carin Charleston, MD    Allergies: Patient has no known allergies.    Review of Systems  Updated Vital Signs BP 131/89 (BP Location: Left Arm)   Pulse 90   Temp 98 F (36.7 C) (Oral)   Resp 18   SpO2 100%   Physical Exam Vitals and nursing note reviewed.  Constitutional:       General: She is not in acute distress.    Appearance: She is well-developed.  HENT:     Head: Normocephalic and atraumatic.     Right Ear: Tympanic membrane, ear canal and external ear normal.     Left Ear: Tympanic membrane, ear canal and external ear normal.     Nose: Nose normal.   Eyes:     Extraocular Movements: Extraocular movements intact.     Conjunctiva/sclera: Conjunctivae normal.     Pupils: Pupils are equal, round, and reactive to light.    Cardiovascular:     Rate and Rhythm: Regular rhythm. Tachycardia present.     Heart sounds: No murmur heard. Pulmonary:     Effort: Pulmonary effort is normal. No respiratory distress.     Breath sounds: Normal breath sounds.   Musculoskeletal:     Cervical back: Normal range of motion and neck supple.     Right lower leg: No edema.     Left lower leg: No edema.   Skin:    General: Skin is warm and dry.   Neurological:     Mental Status: She is alert and oriented to person, place, and time. Mental status is at baseline.     Cranial Nerves: No cranial nerve deficit.     Sensory: No sensory deficit.     Motor: No weakness.     Coordination: Coordination  normal.     Gait: Gait normal.     Comments: Negative dix hallpike bilaterally  Psychiatric:        Mood and Affect: Mood normal.     (all labs ordered are listed, but only abnormal results are displayed) Labs Reviewed  CBC WITH DIFFERENTIAL/PLATELET - Abnormal; Notable for the following components:      Result Value   Abs Immature Granulocytes 0.09 (*)    All other components within normal limits  BASIC METABOLIC PANEL WITH GFR - Abnormal; Notable for the following components:   Potassium 3.3 (*)    CO2 21 (*)    Glucose, Bld 105 (*)    All other components within normal limits  CBG MONITORING, ED - Abnormal; Notable for the following components:   Glucose-Capillary 152 (*)    All other components within normal limits    EKG: EKG  Interpretation Date/Time:  Tuesday December 25 2023 10:10:55 EDT Ventricular Rate:  130 PR Interval:  155 QRS Duration:  93 QT Interval:  304 QTC Calculation: 447 R Axis:   56  Text Interpretation: Sinus tachycardia LAE, consider biatrial enlargement Confirmed by Shyrl Doyne [phone]) on 12/25/2023 10:14:15 AM  Radiology: MR BRAIN WO CONTRAST Result Date: 12/25/2023 CLINICAL DATA:  Dizziness for months. EXAM: MRI HEAD WITHOUT CONTRAST TECHNIQUE: Multiplanar, multiecho pulse sequences of the brain and surrounding structures were obtained without intravenous contrast. COMPARISON:  None Available. FINDINGS: Brain: No acute infarct. No evidence of intracranial hemorrhage. Nonspecific T2/FLAIR hyperintensity in the periventricular and subcortical white matter. No edema, mass effect, or midline shift. Posterior fossa is unremarkable. Normal appearance of midline structures. The basilar cisterns are patent. No extra-axial fluid collections. Ventricles: Normal size and configuration of the ventricles. Vascular: Skull base flow voids are visualized. Skull and upper cervical spine: No focal abnormality. Sinuses/Orbits: Orbits are symmetric. Mild mucosal thickening in the ethmoid and maxillary sinuses. Other: Mastoid air cells are clear. IMPRESSION: No acute intracranial abnormality. Nonspecific white matter signal abnormality which may reflect mild chronic microvascular ischemic changes. Differential also includes sequelae of chronic migraines, prior infection/inflammatory process. Electronically Signed   By: Denny Flack M.D.   On: 12/25/2023 11:48     Procedures   Medications Ordered in the ED  scopolamine (TRANSDERM-SCOP) 1 MG/3DAYS 1.5 mg (1.5 mg Transdermal Patch Applied 12/25/23 1125)  lactated ringers  bolus 1,000 mL (1,000 mLs Intravenous New Bag/Given 12/25/23 1125)  diazepam  (VALIUM ) injection 5 mg (5 mg Intravenous Given 12/25/23 1053)  potassium chloride SA (KLOR-CON M) CR tablet 40 mEq (40 mEq  Oral Given 12/25/23 1215)                                    Medical Decision Making Amount and/or Complexity of Data Reviewed Labs: ordered. Radiology: ordered.  Risk Prescription drug management.   53 year old female with history of vertigo presents emergency department with dizziness  Initial Ddx:  Peripheral vertigo, central vertigo, stroke  MDM/Course:  Patient presents emergency department with vertigo.  Also has had some tenderness in the right ear.  Appears to be transient but no clear triggers.  Supposed to have an MRI but was unable to tolerate due to her anxiety.  Outpatient ENT feels that it is likely vestibular migraine which I suspect as well.  Was tachycardic on arrival but I suspect this is due to anxiety.  Has a nonfocal neuroexam and is able to ambulate without difficulty.  Dix-Hallpike was negative bilaterally.  Since she was supposed to have an MRI did obtain it today which was normal.  Upon re-evaluation heart rate improved after the Valium  and IV fluids.  He was feeling better after the scopolamine as well.  I have prescribed this for her and will have her continue to follow-up with ENT for her suspected vestibular migraine.  This patient presents to the ED for concern of complaints listed in HPI, this involves an extensive number of treatment options, and is a complaint that carries with it a high risk of complications and morbidity. Disposition including potential need for admission considered.   Dispo: DC Home. Return precautions discussed including, but not limited to, those listed in the AVS. Allowed pt time to ask questions which were answered fully prior to dc.  Records reviewed Outpatient Clinic Notes and ED Visit Notes The following labs were independently interpreted: Chemistry and show hypokalemia I personally reviewed and interpreted cardiac monitoring: normal sinus rhythm  and sinus tachycardia I personally reviewed and interpreted the pt's EKG: see  above for interpretation  I have reviewed the patients home medications and made adjustments as needed  Portions of this note were generated with Dragon dictation software. Dictation errors may occur despite best attempts at proofreading.     Final diagnoses:  Dizziness    ED Discharge Orders          Ordered    scopolamine (TRANSDERM-SCOP) 1 MG/3DAYS  every 72 hours        12/25/23 1154               Ninetta Basket, MD 12/25/23 1217

## 2023-12-25 NOTE — ED Notes (Signed)
 Advised provider about pt status

## 2023-12-25 NOTE — ED Notes (Signed)
 Patient transported to MRI

## 2024-01-03 DIAGNOSIS — R42 Dizziness and giddiness: Secondary | ICD-10-CM | POA: Diagnosis not present

## 2024-01-03 DIAGNOSIS — F1010 Alcohol abuse, uncomplicated: Secondary | ICD-10-CM | POA: Diagnosis not present

## 2024-01-03 DIAGNOSIS — E782 Mixed hyperlipidemia: Secondary | ICD-10-CM | POA: Diagnosis not present

## 2024-01-03 DIAGNOSIS — F339 Major depressive disorder, recurrent, unspecified: Secondary | ICD-10-CM | POA: Diagnosis not present

## 2024-01-03 DIAGNOSIS — F419 Anxiety disorder, unspecified: Secondary | ICD-10-CM | POA: Diagnosis not present

## 2024-01-28 DIAGNOSIS — R42 Dizziness and giddiness: Secondary | ICD-10-CM | POA: Diagnosis not present

## 2024-01-28 DIAGNOSIS — R419 Unspecified symptoms and signs involving cognitive functions and awareness: Secondary | ICD-10-CM | POA: Diagnosis not present

## 2024-01-28 DIAGNOSIS — F419 Anxiety disorder, unspecified: Secondary | ICD-10-CM | POA: Diagnosis not present

## 2024-02-04 ENCOUNTER — Emergency Department (HOSPITAL_COMMUNITY)
Admission: EM | Admit: 2024-02-04 | Discharge: 2024-02-04 | Disposition: A | Discharge status: Home / Self Care | Attending: Emergency Medicine | Admitting: Emergency Medicine

## 2024-02-04 ENCOUNTER — Other Ambulatory Visit: Payer: Self-pay

## 2024-02-04 ENCOUNTER — Emergency Department (HOSPITAL_COMMUNITY)

## 2024-02-04 DIAGNOSIS — R42 Dizziness and giddiness: Secondary | ICD-10-CM | POA: Insufficient documentation

## 2024-02-04 DIAGNOSIS — R55 Syncope and collapse: Secondary | ICD-10-CM | POA: Diagnosis not present

## 2024-02-04 DIAGNOSIS — R519 Headache, unspecified: Secondary | ICD-10-CM | POA: Insufficient documentation

## 2024-02-04 DIAGNOSIS — R252 Cramp and spasm: Secondary | ICD-10-CM | POA: Insufficient documentation

## 2024-02-04 LAB — COMPREHENSIVE METABOLIC PANEL WITH GFR
ALT: 22 U/L (ref 0–44)
AST: 26 U/L (ref 15–41)
Albumin: 4.1 g/dL (ref 3.5–5.0)
Alkaline Phosphatase: 76 U/L (ref 38–126)
Anion gap: 10 (ref 5–15)
BUN: 13 mg/dL (ref 6–20)
CO2: 25 mmol/L (ref 22–32)
Calcium: 9.2 mg/dL (ref 8.9–10.3)
Chloride: 104 mmol/L (ref 98–111)
Creatinine, Ser: 0.77 mg/dL (ref 0.44–1.00)
GFR, Estimated: >60 mL/min (ref >60)
Glucose, Bld: 93 mg/dL (ref 70–99)
Potassium: 3.8 mmol/L (ref 3.5–5.1)
Sodium: 139 mmol/L (ref 135–145)
Total Bilirubin: 0.8 mg/dL (ref 0.0–1.2)
Total Protein: 7.7 g/dL (ref 6.5–8.1)

## 2024-02-04 LAB — CBC
HCT: 43.2 % (ref 36.0–46.0)
Hemoglobin: 14.2 g/dL (ref 12.0–15.0)
MCH: 30.1 pg (ref 26.0–34.0)
MCHC: 32.9 g/dL (ref 30.0–36.0)
MCV: 91.7 fL (ref 80.0–100.0)
Platelets: 225 K/uL (ref 150–400)
RBC: 4.71 MIL/uL (ref 3.87–5.11)
RDW: 12.5 % (ref 11.5–15.5)
WBC: 6.8 K/uL (ref 4.0–10.5)
nRBC: 0 % (ref 0.0–0.2)

## 2024-02-04 LAB — URINALYSIS, ROUTINE W REFLEX MICROSCOPIC
Bilirubin Urine: NEGATIVE
Glucose, UA: NEGATIVE mg/dL
Hgb urine dipstick: NEGATIVE
Ketones, ur: NEGATIVE mg/dL
Leukocytes,Ua: NEGATIVE
Nitrite: NEGATIVE
Protein, ur: NEGATIVE mg/dL
Specific Gravity, Urine: 1.003 — ABNORMAL LOW (ref 1.005–1.030)
pH: 5 (ref 5.0–8.0)

## 2024-02-04 LAB — POC URINE PREG, ED: Preg Test, Ur: NEGATIVE

## 2024-02-04 NOTE — ED Provider Notes (Signed)
 [place] EMERGENCY DEPARTMENT AT [facility] Provider Note   CSN: [id number] Arrival date & time: 02/04/24  1816     Patient presents with: Spasms, Headache, and Dizziness   Brenda Lopez is a 53 y.o. female.   HPI    53 year old female comes to the ER with her husband with chief complaint of near fainting. Patient has no concerning past medical history.  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  In the last several months, she has been experiencing dizziness.  She has seen PCP, urgent care, ER and ENT doctor.  Ultimately, it was thought that she likely has vertigo, may be vestibular migraines.  Patient was given meclizine , scopolamine  -and she states that the dizziness over time is improved.  However, now she has been having headaches the last several weeks.  The headaches are frontal, intermittent, severe when they arrive.  In the last 2 days, she has had episodes of pain in her neck.  With those episodes, she feels short of breath and she describes the pain as someone standing on her neck.  She feels that she is going to faint when this occurs.  She has been having episodes of near fainting daily for the last several days, but the last 2 days the symptoms were more pronounced and the neck pain is new.  Additionally, yesterday patient was woken up by her headache and thereafter she noted that she was having twitching/jerking of her body.  Those symptoms will resolve and she would walk, but returned when she was resting.  Patient has no history of seizures.  She denies any history of blood clots in her legs, lungs.  She denies any other illicit drug use.  There is no family history of neuromuscular disease, autoimmune disease.  Patient has no history of clots.  She has no cardiac disease history.  She currently takes hydroxyzine for her symptoms.  Hydroxyzine was prescribed by her PCP, who  has been telling her that most likely her symptoms are because of anxiety -however, she does not think that her symptoms are because of anxiety.  Patient's PCP did give her referral to neurology, but that appointment is not going to be until November.  She does not feel she can wait until then, as her symptoms have progressed.  Prior to Admission medications   Medication Sig Start Date End Date Taking? Authorizing Provider  budesonide -formoterol  (SYMBICORT ) 80-4.5 MCG/ACT inhaler Inhale 2 puffs into the lungs 2 (two) times daily. 02/09/15   Joshua Debby CROME, MD  meclizine  (ANTIVERT ) 25 MG tablet Take 1 tablet (25 mg total) by mouth 3 (three) times daily as needed for dizziness. 11/29/23   Ula Prentice SAUNDERS, MD  scopolamine  (TRANSDERM-SCOP) 1 MG/3DAYS Place 1 patch (1.5 mg total) onto the skin every 3 (three) days. 12/25/23   Yolande Lamar BROCKS, MD    Allergies: Patient has no known allergies.    Review of Systems  All other systems reviewed and are negative.   Updated Vital Signs BP (!) 131/96   Pulse (!) 102   Temp 98.4 F (36.9 C) (Oral)   Resp 18   SpO2 98%   Physical Exam Vitals and nursing note reviewed.  Constitutional:      Appearance: She is well-developed.  HENT:     Head: Atraumatic.  Eyes:     Pupils: Pupils are equal, round, and reactive to light.  Comments: 5 mm, equal, no RAPD  Neck:     Comments: No meningismus Cardiovascular:     Rate and Rhythm: Normal rate.  Pulmonary:     Effort: Pulmonary effort is normal.  Musculoskeletal:     Cervical back: Normal range of motion and neck supple.  Skin:    General: Skin is warm and dry.  Neurological:     Mental Status: She is alert and oriented to person, place, and time.     GCS: GCS eye subscore is 4. GCS verbal subscore is 5. GCS motor subscore is 6.     Cranial Nerves: No cranial nerve deficit, dysarthria or facial asymmetry.     (all labs ordered are listed, but only abnormal results are displayed) Labs  Reviewed  URINALYSIS, ROUTINE W REFLEX MICROSCOPIC - Abnormal; Notable for the following components:      Result Value   Color, Urine COLORLESS (*)    Specific Gravity, Urine 1.003 (*)    All other components within normal limits  CBC  COMPREHENSIVE METABOLIC PANEL WITH GFR  POC URINE PREG, ED    EKG: None  Radiology: CT Head Wo Contrast Result Date: 02/04/2024 CLINICAL DATA:  Headache EXAM: CT HEAD WITHOUT CONTRAST TECHNIQUE: Contiguous axial images were obtained from the base of the skull through the vertex without intravenous contrast. RADIATION DOSE REDUCTION: This exam was performed according to the departmental dose-optimization program which includes automated exposure control, adjustment of the mA and/or kV according to patient size and/or use of iterative reconstruction technique. COMPARISON:  MRI 12/25/2023 FINDINGS: Brain: No acute intracranial abnormality. Specifically, no hemorrhage, hydrocephalus, mass lesion, acute infarction, or significant intracranial injury. Vascular: No hyperdense vessel or unexpected calcification. Skull: No acute calvarial abnormality. Sinuses/Orbits: No acute findings Other: None IMPRESSION: No acute intracranial abnormality. Electronically Signed   By: Franky Crease M.D.   On: 02/04/2024 20:25     Procedures   Medications Ordered in the ED - No data to display                                  Medical Decision Making Amount and/or Complexity of Data Reviewed Labs: ordered.   53 year old patient comes in with multiple complaints/concerns, however primarily she has been experiencing headaches, neck pain for the last 2 days with near fainting.  She has had episodes of dizziness, vertigo for the last several months.  However, the dizziness has gotten worse.  Now she is having some near fainting spells.  There is no cardiac prodrome associated with the near fainting spells.  He also has no other focal neurodeficits during the episodes, but patient has  experienced jerking.  Besides alcohol use, there is no other concerning social history.  Family history is reassuring.  Patient has no concerning past medical history.  Collateral history provided by patient's husband who is at the bedside.  I also reviewed patient's records including Care Everywhere.  Patient had an MRI of the brain done in June, which was negative for stroke.  She had D-dimer, troponins ordered at visit in May which were also normal.  TSH was normal.  I reviewed care everywhere, it appears that the PCP wants patient to see a neurologist.  Differential diagnosis consideration for this patient includes: Primary headaches - including complex migrainous headaches, cluster headaches, tension headaches, vestibular migraine ICH Carotid dissection /vertebral dissection Cavernous sinus thrombosis or dural venous thrombosis Vascular headaches AV malformation Brain  aneurysm Muscular headaches  Assessment and plan: I went over patient's presenting symptoms and spent significant time going over the timeline.  I also reviewed patient's records with her including MRI of the brain.  It is unclear to be at this time what is causing her symptoms.  It appears that there is some evolution and progression of her symptoms.  However, patient has reassuring past medical history and family history, social history.  I do think that she needs to see a neurologist, but I do not think patient needs emergent neurologic evaluation currently.  I do not think admission to the hospital will lead to eventual diagnosis given the intermittent nature of her symptoms and patient being currently symptom-free.  Basic labs were ordered along with CT scan of the brain.  CT scan of the brain without contrast is negative for acute findings.  I independently interpreted CT brain.  Labs are reassuring.  I discussed with the patient that the only test I can think of right now that can add value of CT angiogram, given that  she has had some neck pain with near syncope.  My suspicion for aneurysm, dissection in her situation given that she has no concerning social history, family history is low.  She has no cardiac history, therefore I have very low suspicion for her having arrhythmia.  I informed her that the MRI brain was effective in ruling out a lot of abnormal pathology as well, however neurologist might see her and decide to get different kind of MRI or MRI with contrast.  Patient has history of longstanding alcohol use -therefore there could be metabolic causes, chronic changes to the brain that neurologist might be able to explain.  Patient could have dysautonomia.  Patient and husband still concerned about patient's symptoms.  The wife has decided not to get CT angiogram head and neck.  They are thankful for a closer follow-up with neurologist.  We offered CT angiogram and additional cardiac telemetry monitoring, but patient comfortable with prompt neuro follow-up and is feeling reassured with normal CT brain.  Neurology referral has been placed.   Final diagnoses:  Near syncope  Intermittent headache    ED Discharge Orders          Ordered    Ambulatory referral to Neurology       Comments: An appointment is requested in approximately: 2 weeks   02/04/24 2236               Charlyn Sora, MD 02/04/24 [phone]

## 2024-02-04 NOTE — ED Notes (Signed)
 Noted patient has rapid speech pattern and appears anxious, twisting cloth in hands.  SO at bedside with patient at the present time.

## 2024-02-04 NOTE — ED Triage Notes (Signed)
 Pt reports headaches that make her feel like she will pass out, intermittent blurry vision, and uncontrollable muscle spasms during her sleep. Pt has appt w/ neurologist, but not until 04/23/24.

## 2024-02-04 NOTE — Discharge Instructions (Signed)
 The workup in the ER is overall reassuring.  CT scan of the brain, blood work, EKG are reassuring.  Previously you have had cardiac workup, evaluation for blood clots in the lung and MRI of the brain which were also normal.  We are not sure what is causing your symptoms. The workup in the ER is not complete, and is limited to screening for life threatening and emergent conditions only, so we recommend that you continue to follow-up with PCP.  We will also put in a referral for neurologist.  Return to the ER if you have a fainting spells, seizure like activities.  Please return to the ER if the headache gets severe and is not improving, you have associated new one sided numbness, tingling, weakness or confusion, seizures, poor balance or poor vision.  Start keeping a log of your blood pressure.  Ideally, keep journal of circumstances surrounding the episode of dizziness or near fainting.

## 2024-02-06 ENCOUNTER — Encounter: Payer: Self-pay | Admitting: Neurology

## 2024-02-06 ENCOUNTER — Telehealth: Payer: Self-pay | Admitting: Adult Health

## 2024-02-06 ENCOUNTER — Ambulatory Visit: Admitting: Neurology

## 2024-02-06 VITALS — BP 139/93 | HR 101 | Ht 64.25 in | Wt 113.0 lb

## 2024-02-06 DIAGNOSIS — G44209 Tension-type headache, unspecified, not intractable: Secondary | ICD-10-CM | POA: Diagnosis not present

## 2024-02-06 DIAGNOSIS — R42 Dizziness and giddiness: Secondary | ICD-10-CM

## 2024-02-06 DIAGNOSIS — F419 Anxiety disorder, unspecified: Secondary | ICD-10-CM

## 2024-02-06 MED ORDER — CITALOPRAM HYDROBROMIDE 10 MG PO TABS: 10.0000 mg | ORAL_TABLET | Freq: Every day | ORAL | 0 refills | Status: DC

## 2024-02-06 MED ORDER — DIAZEPAM 5 MG PO TABS
5.0000 mg | ORAL_TABLET | Freq: Two times a day (BID) | ORAL | 0 refills | Status: AC | PRN
Start: 2024-02-06 — End: ?

## 2024-02-06 NOTE — Patient Instructions (Signed)
 Start citalopram  10 mg daily Use Valium  only as needed for severe case of dizziness/anxiety Continue to follow-up with your PCP Return as needed

## 2024-02-06 NOTE — Telephone Encounter (Signed)
 NP Megan called the pt and pt returned call. Call was disconnected but NP will call pt back later on today after meeting or tomorrow morning.

## 2024-02-06 NOTE — Progress Notes (Signed)
 [facility]  PATIENT: Brenda Lopez DOB: May 02, 1971  REQUESTING CLINICIAN: Charlyn Sora, MD HISTORY FROM: Patient/Husband  REASON FOR VISIT: Dizziness    HISTORICAL  CHIEF COMPLAINT:  Chief Complaint  Patient presents with   New Patient (Initial Visit)    Room 13 Pt is here with spouse  Pain in head that she fall out, she stated that has jerk in arms and legs , nausea , she was told that has vertigo and anxiety , she think that it something, she has follow up with eye  for vision     HISTORY OF PRESENT ILLNESS:  Discussed the use of AI scribe software for clinical note transcription with the patient, who gave verbal consent to proceed.  Brenda Lopez is a 53 year old female wiht history of alcohol use disorder who presents with severe dizziness. She was referred by an emergency room physician for further evaluation of her symptoms.  Over the past four months, she has experienced episodes of extreme dizziness, initially occurring at home in the morning, lasting about two minutes. The dizziness intensified around mid-May, with an episode at work where she felt the floor moving and walls spinning, requiring assistance to leave the building. She was taken to urgent care and was prescribed prednisone for a suspected inner ear infection, which provided some relief but did not resolve the dizziness.  Subsequent episodes occurred, including one at a grocery store where she felt like she was going to black out, necessitating assistance to sit down. She experienced similar symptoms while working from home, prompting a visit to the emergency room where blood work was done, but she declined an MRI at that time. A week later, she developed severe head pain and pressure, described as a 'cinder block' on her brain, leading to another ER visit where an MRI was performed and showed no abnormalities.  She reports worsening head pressure and pain, particularly at night, accompanied by  sensations of 'electricity' and 'Rice Krispies' sounds in her sinuses. She experiences spasms and twitching that stop when she moves. Blurry vision and fluid under her eyes have also been noted, with the worst pain and pressure occurring on a recent Sunday and Monday, described as a 'metal band' squeezing her skull and a lack of oxygen sensation.  She has been to multiple healthcare providers, including three ER visits, an ENT specialist, and several urgent care and nurse practitioner consultations. She reports that previous clinicians considered diagnoses such as inner ear infection, vertigo, and anxiety, and she has tried treatments including meclizine , hydroxyzine, and scopolamine , but she did not experience relief from these medications. She has also experienced sensitivity to sound, startling easily at loud noises.  She describes neuropathy symptoms, including burning and tingling sensations in her arms, legs, and mouth, sometimes feeling like her limbs are 'falling asleep.' These symptoms have been intermittent and vary in intensity.  Her social history includes a significant stressor related to potential job loss after 20 years of employment, [employer], which she acknowledges may contribute to her anxiety. She has a history of alcohol use, currently consuming two light beers daily, reduced from five to seven per day over the past 20 years. She has a family history of alcoholism and is cautious about medication use due to her sister's history of substance abuse.    OTHER MEDICAL CONDITIONS: Alcohol use disorder, anxiety    REVIEW OF SYSTEMS: Full 14 system review of systems performed and negative with exception of: As noted in the HPI  ALLERGIES: No Known Allergies  HOME MEDICATIONS: Outpatient Medications Prior to Visit  Medication Sig Dispense Refill   hydrOXYzine (ATARAX) 25 MG tablet Take 25 mg by mouth.     magnesium citrate SOLN Take 1 Bottle by mouth once.     meclizine  (ANTIVERT ) 25  MG tablet Take 1 tablet (25 mg total) by mouth 3 (three) times daily as needed for dizziness. 30 tablet 0   Melatonin 10 MG CAPS Take 20 mg by mouth.     Multiple Vitamins-Minerals (MULTIVITAMIN WITH MINERALS) tablet Take 1 tablet by mouth daily.     budesonide -formoterol  (SYMBICORT ) 80-4.5 MCG/ACT inhaler Inhale 2 puffs into the lungs 2 (two) times daily. (Patient not taking: Reported on 02/06/2024) 1 Inhaler 11   cholecalciferol (VITAMIN D3) 25 MCG (1000 UNIT) tablet Take 1,000 Units by mouth.     scopolamine  (TRANSDERM-SCOP) 1 MG/3DAYS Place 1 patch (1.5 mg total) onto the skin every 3 (three) days. (Patient not taking: Reported on 02/06/2024) 10 patch 12   No facility-administered medications prior to visit.    PAST MEDICAL HISTORY: Past Medical History:  Diagnosis Date   Allergy    Asthma    GERD (gastroesophageal reflux disease)    History of chicken pox    Hypertension     PAST SURGICAL HISTORY: Past Surgical History:  Procedure Laterality Date   CESAREAN SECTION  2009    FAMILY HISTORY: Family History  Problem Relation Age of Onset   COPD Paternal Aunt    Alcohol abuse Other    Arthritis Other    Cancer Other        Ovarian and Prostate   Diabetes Other    Alcohol abuse Father    Heart disease Neg Hx    Hyperlipidemia Neg Hx    Hypertension Neg Hx    Stroke Neg Hx     SOCIAL HISTORY: Social History   Socioeconomic History   Marital status: Married    Spouse name: Not on file   Number of children: Not on file   Years of education: Not on file   Highest education level: Not on file  Occupational History   Not on file  Tobacco Use   Smoking status: Former    Current packs/day: 0.00    Average packs/day: 1 pack/day for 8.0 years (8.0 ttl pk-yrs)    Types: Cigarettes    Start date: 09/21/1988    Quit date: 09/21/1996    Years since quitting: 27.3   Smokeless tobacco: Not on file  Substance and Sexual Activity   Alcohol use: Yes    Alcohol/week: 35.0  standard drinks of alcohol    Types: 35 Cans of beer per week   Drug use: No   Sexual activity: Not on file  Other Topics Concern   Not on file  Social History Narrative   Not on file   Social Drivers of Health   Financial Resource Strain: Not on file  Food Insecurity: Not on file  Transportation Needs: Not on file  Physical Activity: Not on file  Stress: Not on file  Social Connections: Not on file  Intimate Partner Violence: Not on file    PHYSICAL EXAM  GENERAL EXAM/CONSTITUTIONAL: Vitals:  Vitals:   02/06/24 0846  BP: (!) 139/93  Pulse: (!) 101  Weight: 113 lb (51.3 kg)  Height: 5' 4.25 (1.632 m)   Body mass index is 19.25 kg/m. Wt Readings from Last 3 Encounters:  02/06/24 113 lb (51.3 kg)  02/09/15 121 lb (54.9  kg)  09/22/11 122 lb (55.3 kg)   Patient is in no distress; well developed, nourished and groomed; neck is supple, appears anxious   MUSCULOSKELETAL: Gait, strength, tone, movements noted in Neurologic exam below  NEUROLOGIC: MENTAL STATUS:      No data to display         awake, alert, oriented to person, place and time recent and remote memory intact normal attention and concentration language fluent, comprehension intact, naming intact fund of knowledge appropriate  CRANIAL NERVE:  2nd, 3rd, 4th, 6th - Visual fields full to confrontation, extraocular muscles intact, no nystagmus 5th - facial sensation symmetric 7th - facial strength symmetric 8th - hearing intact 9th - palate elevates symmetrically, uvula midline 11th - shoulder shrug symmetric 12th - tongue protrusion midline  MOTOR:  normal bulk and tone, full strength in the BUE, BLE  SENSORY:  normal and symmetric to light touch  COORDINATION:  finger-nose-finger, fine finger movements normal  GAIT/STATION:  normal    DIAGNOSTIC DATA (LABS, IMAGING, TESTING) - I reviewed patient records, labs, notes, testing and imaging myself where available.  Lab Results   Component Value Date   WBC 6.8 02/04/2024   HGB 14.2 02/04/2024   HCT 43.2 02/04/2024   MCV 91.7 02/04/2024   PLT 225 02/04/2024      Component Value Date/Time   NA 139 02/04/2024 1922   K 3.8 02/04/2024 1922   CL 104 02/04/2024 1922   CO2 25 02/04/2024 1922   GLUCOSE 93 02/04/2024 1922   BUN 13 02/04/2024 1922   CREATININE 0.77 02/04/2024 1922   CALCIUM 9.2 02/04/2024 1922   PROT 7.7 02/04/2024 1922   ALBUMIN 4.1 02/04/2024 1922   AST 26 02/04/2024 1922   ALT 22 02/04/2024 1922   ALKPHOS 76 02/04/2024 1922   BILITOT 0.8 02/04/2024 1922   GFRNONAA >60 02/04/2024 1922   GFRAA  07/23/2007 0505    >60        The eGFR has been calculated using the MDRD equation. This calculation has not been validated in all clinical situations. eGFR's persistently <60 mL/min signify possible Chronic Kidney Disease.   Lab Results  Component Value Date   CHOL 185 09/22/2011   HDL 81.10 09/22/2011   LDLCALC 87 09/22/2011   TRIG 86.0 09/22/2011   CHOLHDL 2 09/22/2011   No results found for: HGBA1C No results found for: VITAMINB12 Lab Results  Component Value Date   TSH 0.857 11/29/2023    MRI Brain 12/25/2023 No acute intracranial abnormality. Nonspecific white matter signal abnormality which may reflect mild chronic microvascular ischemic changes. Differential also includes sequelae of chronic migraines, prior infection/inflammatory process  CT head 02/04/2024 No acute intracranial abnormality     ASSESSMENT AND PLAN  53 y.o. year old female with anxiety who is presenting with multiple complaints of dizziness, headaches, numbness    Recurrent dizziness and vertigo with associated headache and peripheral neuropathy symptoms Experiencing severe dizziness and vertigo for four months, with episodes longer than typical benign positional vertigo. Symptoms include floor moving sensation, walls spinning, and impending blackout. Associated headaches, sound sensitivity, and  peripheral neuropathy symptoms such as numbness and tingling in limbs, mouth and face. MRI and CT scans normal, ruling out central causes like stroke or brain tumor. Differential diagnosis includes vestibular migraine, given dizziness and headache, though definitive diagnosis not established. Another consideration is severe anxiety and she is concerned that she has a brain inflammation process despite reassurance.  - Prescribe Valium  5 mg as needed  for severe symptoms - Prescribe Celexa  (citalopram ) 10 mg daily for anxiety and associated symptoms - Encourage hydration and regular fluid intake - Advise on lifestyle modifications including stress management techniques such as acupuncture or massage therapy  Anxiety disorder Anxiety suspected as a contributing factor, exacerbated by stressors like potential job loss. Acknowledges feeling anxious and open to addressing this component. Previous use of hydroxyzine noted, hesitant to start new medications due to past adverse experiences with antidepressants. - Prescribe Celexa  (citalopram ) 10 mg daily for anxiety - Encourage non-pharmacological interventions such as acupuncture, massage therapy, and stress management techniques - Provide Valium  5 mg as needed for acute anxiety episodes/dizziness  Alcohol use disorder Alcohol use disorder with current consumption reduced to two light beers per day. Previous consumption was five to seven drinks daily for twenty years. Brain imaging shows no significant alcohol-related changes. - Encourage continued reduction in alcohol consumption - Discuss potential risks of abrupt cessation, including blood pressure spikes and seizures     1. Dizziness   2. Anxiety   3. Tension headache      Patient Instructions  Start citalopram  10 mg daily Use Valium  only as needed for severe case of dizziness/anxiety Continue to follow-up with your PCP Return as needed  No orders of the defined types were placed in this  encounter.   Meds ordered this encounter  Medications   diazepam  (VALIUM ) 5 MG tablet    Sig: Take 1 tablet (5 mg total) by mouth every 12 (twelve) hours as needed for anxiety.    Dispense:  15 tablet    Refill:  0   citalopram  (CELEXA ) 10 MG tablet    Sig: Take 1 tablet (10 mg total) by mouth daily.    Dispense:  30 tablet    Refill:  0    Return if symptoms worsen or fail to improve.    Pastor Falling, MD 02/06/2024, 9:54 AM  [facility] [address] [place], [place] [address] [phone]

## 2024-02-07 NOTE — Telephone Encounter (Signed)
 Called patient Brenda Lopez

## 2024-03-05 ENCOUNTER — Other Ambulatory Visit: Payer: Self-pay | Admitting: Neurology
